# Patient Record
Sex: Female | Born: 1965 | Race: White | Hispanic: No | Marital: Married | Smoking: Never smoker
Health system: Southern US, Community
[De-identification: ages and names within clinical notes are randomized; demographics above are authoritative.]

## PROBLEM LIST (undated history)

## (undated) DIAGNOSIS — I10 Essential (primary) hypertension: Secondary | ICD-10-CM

## (undated) DIAGNOSIS — M1990 Unspecified osteoarthritis, unspecified site: Secondary | ICD-10-CM

## (undated) DIAGNOSIS — F32A Depression, unspecified: Secondary | ICD-10-CM

## (undated) DIAGNOSIS — G4730 Sleep apnea, unspecified: Secondary | ICD-10-CM

## (undated) DIAGNOSIS — F909 Attention-deficit hyperactivity disorder, unspecified type: Secondary | ICD-10-CM

## (undated) DIAGNOSIS — F419 Anxiety disorder, unspecified: Secondary | ICD-10-CM

## (undated) DIAGNOSIS — K754 Autoimmune hepatitis: Secondary | ICD-10-CM

## (undated) HISTORY — PX: KNEE SURGERY: SHX244

## (undated) HISTORY — PX: ABDOMINAL HYSTERECTOMY: SHX81

## (undated) HISTORY — PX: OTHER SURGICAL HISTORY: SHX169

## (undated) HISTORY — PX: CHOLECYSTECTOMY: SHX55

---

## 1999-05-17 ENCOUNTER — Ambulatory Visit (HOSPITAL_BASED_OUTPATIENT_CLINIC_OR_DEPARTMENT_OTHER): Admission: RE | Admit: 1999-05-17 | Discharge: 1999-05-17 | Payer: Self-pay | Admitting: General Surgery

## 2002-09-23 ENCOUNTER — Encounter: Payer: Self-pay | Admitting: Neurosurgery

## 2002-09-23 ENCOUNTER — Ambulatory Visit (HOSPITAL_COMMUNITY): Admission: RE | Admit: 2002-09-23 | Discharge: 2002-09-23 | Payer: Self-pay | Admitting: Neurosurgery

## 2003-11-10 ENCOUNTER — Other Ambulatory Visit: Admission: RE | Admit: 2003-11-10 | Discharge: 2003-11-10 | Payer: Self-pay | Admitting: Obstetrics and Gynecology

## 2003-12-05 ENCOUNTER — Encounter (INDEPENDENT_AMBULATORY_CARE_PROVIDER_SITE_OTHER): Payer: Self-pay | Admitting: Specialist

## 2003-12-05 ENCOUNTER — Inpatient Hospital Stay (HOSPITAL_COMMUNITY): Admission: RE | Admit: 2003-12-05 | Discharge: 2003-12-07 | Payer: Self-pay | Admitting: Obstetrics and Gynecology

## 2003-12-14 ENCOUNTER — Ambulatory Visit (HOSPITAL_COMMUNITY): Admission: RE | Admit: 2003-12-14 | Discharge: 2003-12-14 | Payer: Self-pay | Admitting: Obstetrics and Gynecology

## 2004-11-11 ENCOUNTER — Other Ambulatory Visit: Admission: RE | Admit: 2004-11-11 | Discharge: 2004-11-11 | Payer: Self-pay | Admitting: Obstetrics and Gynecology

## 2005-03-04 ENCOUNTER — Ambulatory Visit (HOSPITAL_COMMUNITY): Admission: RE | Admit: 2005-03-04 | Discharge: 2005-03-04 | Payer: Self-pay | Admitting: Obstetrics and Gynecology

## 2006-01-02 ENCOUNTER — Emergency Department (HOSPITAL_COMMUNITY): Admission: EM | Admit: 2006-01-02 | Discharge: 2006-01-02 | Payer: Self-pay | Admitting: Family Medicine

## 2006-01-06 ENCOUNTER — Emergency Department (HOSPITAL_COMMUNITY): Admission: EM | Admit: 2006-01-06 | Discharge: 2006-01-06 | Payer: Self-pay | Admitting: Family Medicine

## 2007-09-08 ENCOUNTER — Ambulatory Visit (HOSPITAL_COMMUNITY): Admission: RE | Admit: 2007-09-08 | Discharge: 2007-09-08 | Payer: Self-pay | Admitting: Obstetrics and Gynecology

## 2007-09-08 ENCOUNTER — Other Ambulatory Visit: Admission: RE | Admit: 2007-09-08 | Discharge: 2007-09-08 | Payer: Self-pay | Admitting: Obstetrics and Gynecology

## 2007-10-26 ENCOUNTER — Other Ambulatory Visit: Payer: Self-pay | Admitting: Obstetrics and Gynecology

## 2007-10-26 ENCOUNTER — Ambulatory Visit: Payer: Self-pay | Admitting: Obstetrics and Gynecology

## 2007-11-03 ENCOUNTER — Encounter: Admission: RE | Admit: 2007-11-03 | Discharge: 2007-11-03 | Payer: Self-pay | Admitting: Family Medicine

## 2007-11-12 ENCOUNTER — Encounter: Admission: RE | Admit: 2007-11-12 | Discharge: 2007-11-12 | Payer: Self-pay | Admitting: Gastroenterology

## 2007-11-25 ENCOUNTER — Ambulatory Visit: Payer: Self-pay | Admitting: Obstetrics and Gynecology

## 2007-12-27 ENCOUNTER — Ambulatory Visit: Payer: Self-pay | Admitting: Obstetrics and Gynecology

## 2007-12-29 ENCOUNTER — Encounter (INDEPENDENT_AMBULATORY_CARE_PROVIDER_SITE_OTHER): Payer: Self-pay | Admitting: General Surgery

## 2007-12-29 ENCOUNTER — Ambulatory Visit: Payer: Self-pay | Admitting: Obstetrics and Gynecology

## 2007-12-29 ENCOUNTER — Ambulatory Visit (HOSPITAL_COMMUNITY): Admission: RE | Admit: 2007-12-29 | Discharge: 2007-12-30 | Payer: Self-pay | Admitting: General Surgery

## 2008-01-18 ENCOUNTER — Ambulatory Visit: Payer: Self-pay | Admitting: Obstetrics and Gynecology

## 2009-03-05 ENCOUNTER — Ambulatory Visit: Payer: Self-pay | Admitting: Obstetrics and Gynecology

## 2009-03-05 ENCOUNTER — Other Ambulatory Visit: Admission: RE | Admit: 2009-03-05 | Discharge: 2009-03-05 | Payer: Self-pay | Admitting: Obstetrics and Gynecology

## 2009-03-08 ENCOUNTER — Ambulatory Visit (HOSPITAL_COMMUNITY): Admission: RE | Admit: 2009-03-08 | Discharge: 2009-03-08 | Payer: Self-pay | Admitting: Obstetrics and Gynecology

## 2009-04-16 ENCOUNTER — Ambulatory Visit: Payer: Self-pay | Admitting: Obstetrics and Gynecology

## 2009-12-04 IMAGING — RF DG CHOLANGIOGRAM OPERATIVE
1 series · 4 of 4 positions shown · non-contrast
Comparison: Abdominal CT 11/12/2007.

CLINICAL DATA: Symptomatic cholelithiasis.

INTRAOPERATIVE CHOLANGIOGRAM
TECHNIQUE: Multiple fluoroscopic spot radiographs were obtained
during intraoperative cholangiogram and are submitted for
interpretation post-operatively.

[Series 1: run · 4 of 53 frames shown]
[frame 3/53]
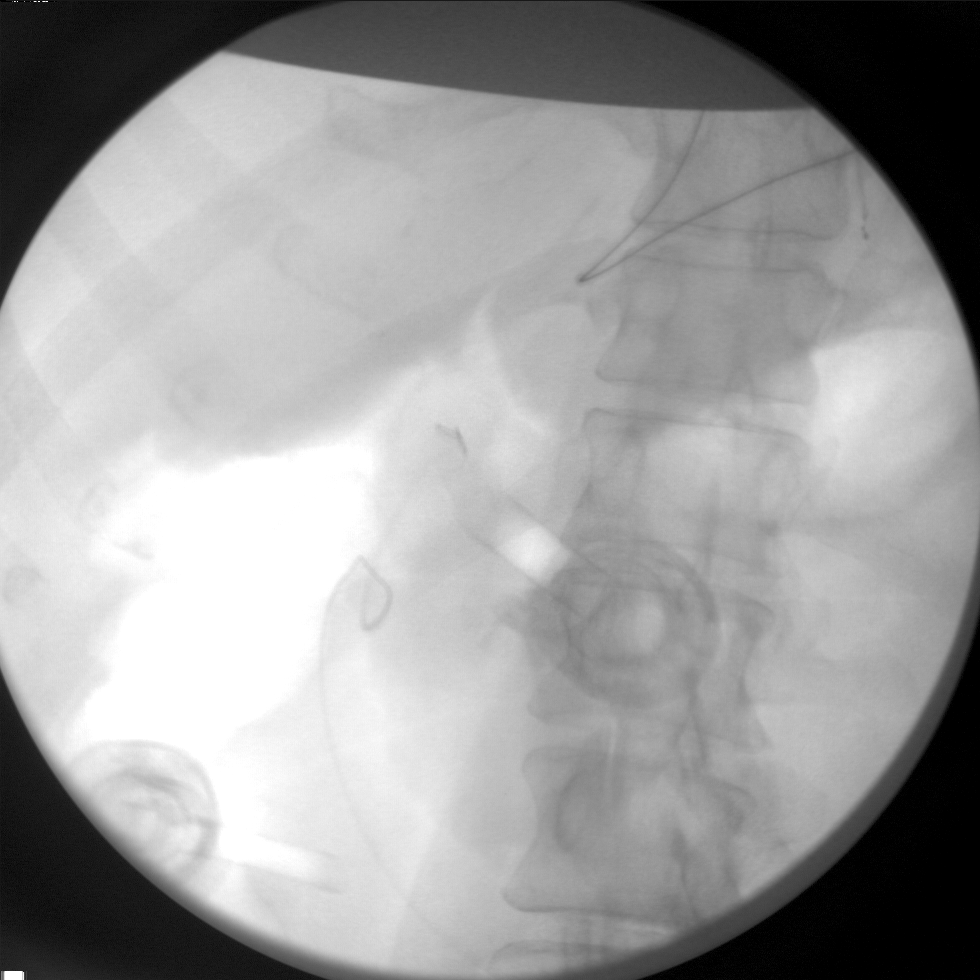
[frame 8/53]
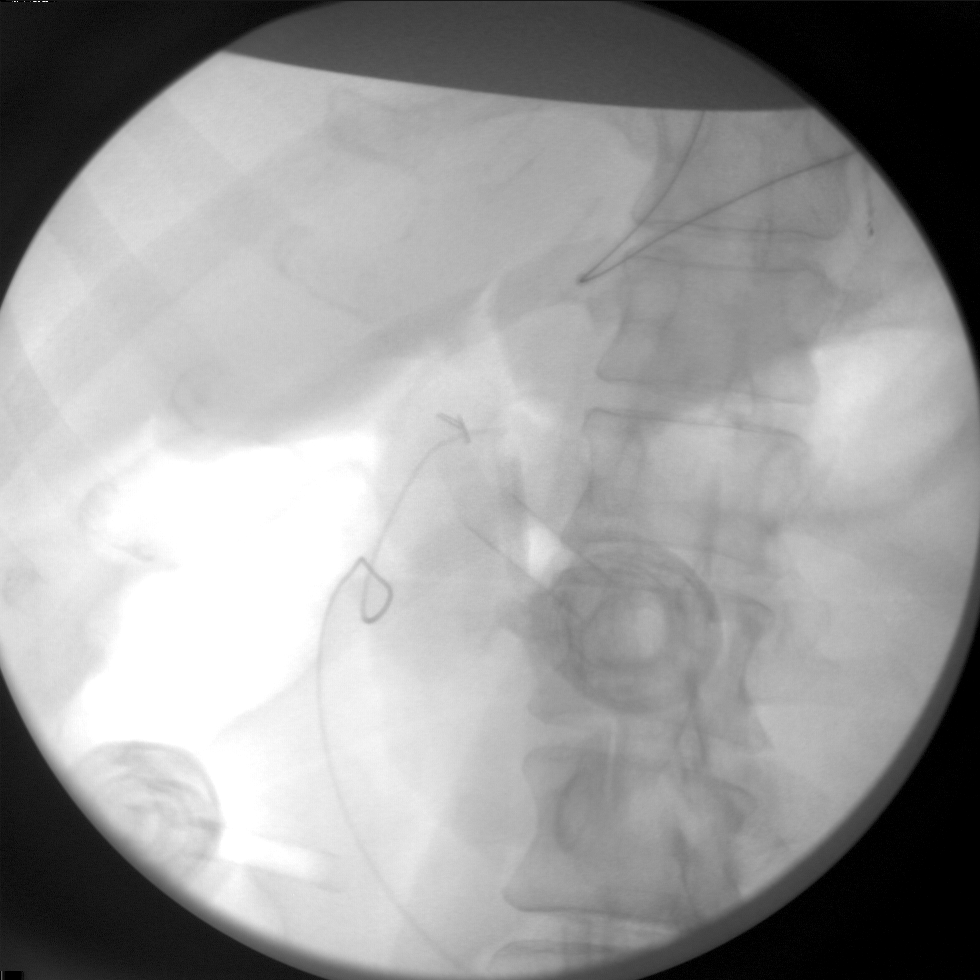
[frame 27/53]
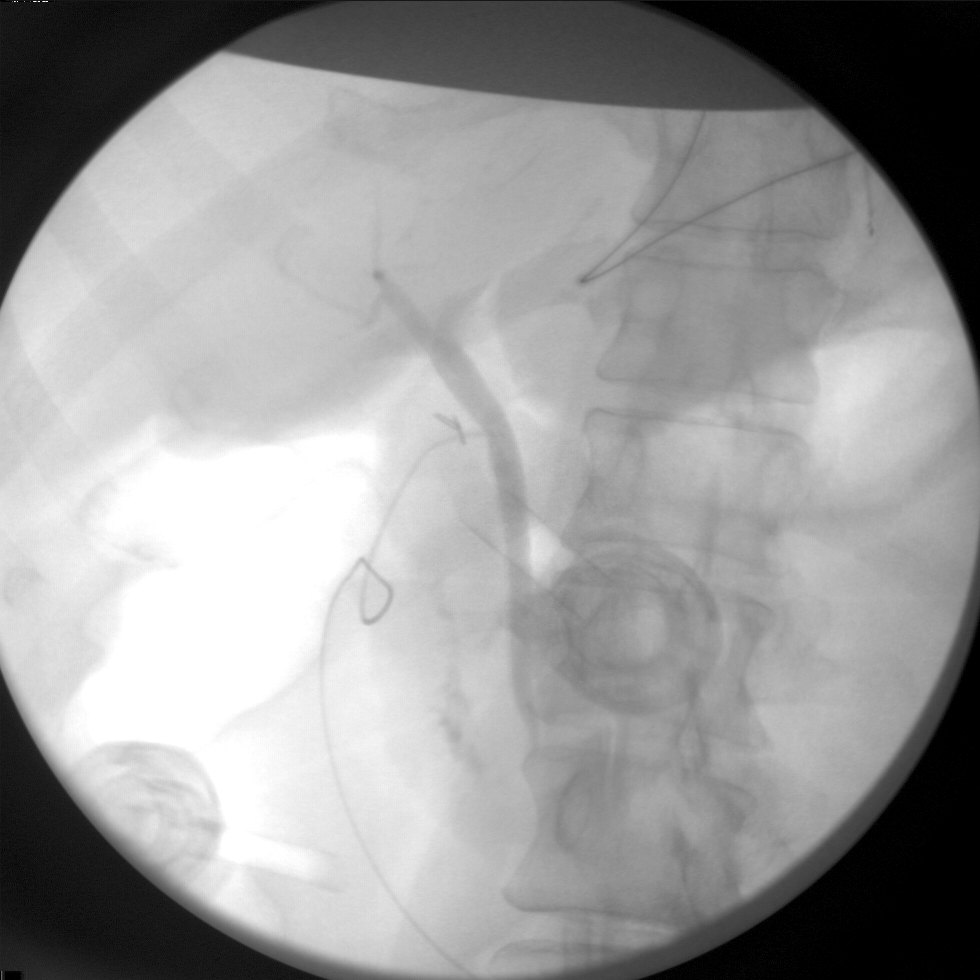
[frame 46/53]
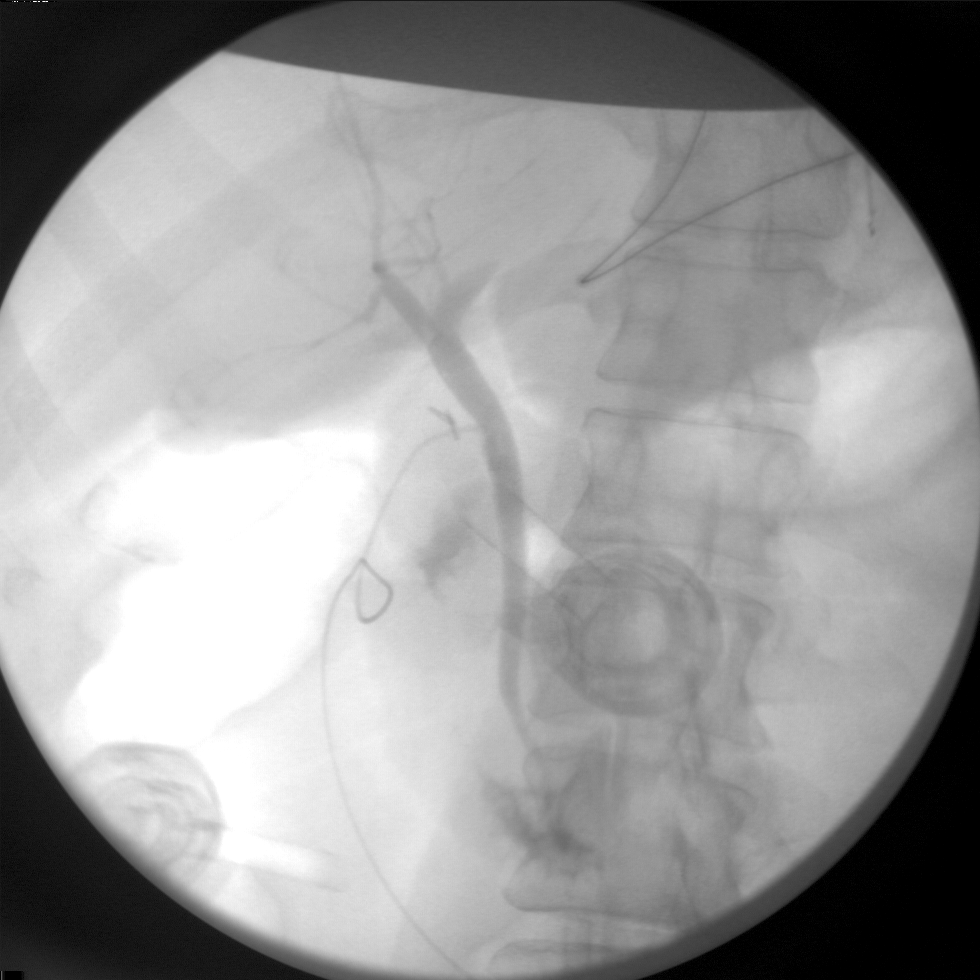

[4 of 4 positions shown; findings below may reference images not displayed]

FINDINGS: No biliary dilatation is demonstrated.  There is drainage
into the duodenum and no extravasation.  It is difficult to exclude
some debris within the common bile duct, but no discrete calculi
are demonstrated.  There is no evidence of ductal obstruction.

On the later images, there is linear activity medial to the distal
common bile duct which is of uncertain significance.  This likely
reflects reflux into the pancreatic duct rather than a low
insertion of an accessory bile duct; correlate clinically.
IMPRESSION: 1.  No evidence of ductal obstruction or definite retained
calculus.
2.  Probable reflux of the pancreatic duct.

## 2010-06-25 NOTE — Op Note (Signed)
[name], [name]                   ACCOUNT NO.:  [id number]   MEDICAL RECORD NO.:  [id number]          PATIENT TYPE:  OIB   LOCATION:  1533                         FACILITY:  [facility]   PHYSICIAN:  Adolph Pollack, M.D.DATE OF BIRTH:  1965/12/25   DATE OF PROCEDURE:  DATE OF DISCHARGE:                               OPERATIVE REPORT   PREOPERATIVE DIAGNOSIS:  Symptomatic cholelithiasis.   POSTOPERATIVE DIAGNOSIS:  Symptomatic cholelithiasis.   PROCEDURE:  Laparoscopic cholecystectomy with intraoperative  cholangiogram.   SURGEON:  Adolph Pollack, M.D.   ASSISTANT:  Leonie Man, MD.   ANESTHESIA:  General.   INDICATIONS:  This 45 year old female is having some upper abdominal  discomfort and nausea and was discovered to have gallstones with a  gallstone measuring 2.2 cm.  She also has some lower abdominal pain and  has plans to have a oophorectomy by Dr. Eda Paschal.  We plan to do a  laparoscopic cholecystectomy in combination with Dr. Verl Dicker  procedure and she presents for that.   TECHNIQUE:  She was seen in the holding area and brought to the  operating room, placed supine on the operating table and general  anesthetic was administered.  She was placed in the lithotomy position  and the abdominal wall and perineal areas were sterilely prepped and  draped.  Marcaine was infiltrated in the subumbilical region.  A small  subumbilical incision was made through the skin, subcutaneous tissue,  fascia and peritoneum entering the peritoneal cavity under direct  vision.  A pursestring suture of 0 Vicryl was placed around the fascial  edges.  A Hassan trocar was introduced to the peritoneal cavity and  pneumoperitoneum created by insufflation of CO2 gas.   Next, the laparoscope introduced and there were adhesions between the  falciform ligament and the right abdominal wall just anterior to the  liver.  Two 5-mm trocars were then placed in the right upper quadrant  and  these adhesions were lysed, freeing up to the midline area just  lateral to the falciform ligament on the right.  An 11-mm trocar was  placed in that area.   The fundus of the gallbladder was grasped and it was noted be somewhat  pale.  We retracted toward the right shoulder.  I then mobilized the  infundibulum and retracted it laterally.  Using blunt dissection, I  isolated the cystic duct and created a window around it.  I then placed  a clip at the cystic duct gallbladder junction and made a small incision  in the cystic duct.  A cholangiocath was passed through the anterior  abdominal wall and placed into the cystic duct and cholangiogram was  formed.   Under real-time fluoroscopy, dilute contrast was injected into the  cystic duct which was of short length.  The common hepatic, right and  left hepatic, common bile ducts all filled promptly and contrast drained  promptly into the duodenum without obvious evidence of obstruction.  Final report is pending the radiologist's interpretation.   The cholangiocath was removed, the cystic duct was clipped three times  on the  biliary side and divided.  I then identified anterior and  posterior branches of the cystic artery and created windows around these  and divided them between clips.  The gallbladder was dissected free from  the liver using electrocautery.  There were intrahepatic portions of the  gallbladder near the body of the fundus and some spillage of bile  occurred with small puncture wounds in the gallbladder.  Once the  gallbladder was removed from the liver it was placed in an Endopouch  bag.   The gallbladder fossa was then copiously irrigated with saline solution  and bleeding points controlled  with electrocautery.  Fluid was  evacuated.  Surgicel was placed in the gallbladder fossa.  Following  this, Dr. Eda Paschal proceeded with his part of the procedure and is to  be dictated in a separate note.   At the end of Dr.  Verl Dicker procedure, I then inspected the  gallbladder fossa and it was hemostatic without evidence of bile leak.  I evacuated as much of the irrigation fluid as possible.  I then removed  the subumbilical trocar and all of the 5-mm trocars (two more had been  put in by Dr. Eda Paschal).  I closed the subumbilical fascial defect with  interrupted 0 Vicryl sutures.  I then inspected this by way of  laparoscopy and the closure was satisfactory.  The remaining trocar was  removed and CO2 gas released.   All skin incisions were then closed with 4-0 Monocryl subcuticular  stitches.  Steri-Strips and sterile dressings were applied.  She  tolerated the procedure without any apparent complications and was taken  to recovery in satisfactory condition.      Adolph Pollack, M.D.  Electronically Signed     TJR/MEDQ  D:  12/29/2007  T:  12/30/2007  Job:  [id number]   cc:   Reuel Boom L. Eda Paschal, M.D.  Fax: [phone]   Duncan Dull, M.D.  Fax: [phone]

## 2010-06-25 NOTE — Op Note (Signed)
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   POSTOPERATIVE DIAGNOSES:  Pelvic pain, pelvic adhesions, severe  dyspareunia.   OPERATION:  Laparoscopy with bilateral salpingo-oophorectomy after  massive lysis of pelvic adhesions.   SURGEON:  Dr. Eda [name]   FIRST ASSISTANT:  Dr. Abbey [name]   INDICATIONS:  The patient is a 45 year old female who had been scheduled  for laparoscopic cholecystectomy because of upper abdominal pain and the  findings of multiple gallstones.  She had recurrent ovarian cyst  although at the moment she appears to be free of them.  They get very  large and then they will resolve, and for a while we thought that this  was the source of her pelvic pain.  However, they are now gone and she  is still having pelvic pain, some of which certainly could be due to her  gallbladder, but some of which I think is due to pelvic adhesive disease  after a TAH.  She also has dyspareunia which I certainly do not think  could be caused by the gallbladder disease.  A long consult was held  with the patient.  She was counseled strongly about the risks of loosing  her ovaries at 79, but she would like me to proceed with surgery.  The  plan was to do it laparoscopically after Dr. Abbey [name] did his  cholecystectomy and then do a laparotomy if we cannot do it  laparoscopically, if Dr. Abbey [name] and I were agreement that this was  appropriate.   FINDINGS AT THE TIME OF SURGERY:  The patient's right ovary was slightly  adherent both to the broad ligament and to some bowel.   However, the  left side, the ovary was densely adherent to small and large bowel, to  epiploic appendages, and was buried under sigmoid colon.   PROCEDURE:  I scrubbed into the case after Dr. Abbey [name] had finished  his laparoscopic cholecystectomy with IV cholangiogram.  We tilted the  patient back in dorsal lithotomy position.  We added two 5-mm trocars in  the right and left lower quadrant in order to get better access to the  pelvis.  I was able to free up the right ovary without much difficulty  using a scissor.  The ureter could be identified.  The right  infundibulopelvic ligament was bipolared and cut.  The rest of the  attachments of the ovary to the broad ligament were bipolared and cut as  well as the round ligament posteriorly.  Sharp dissection was done to  free up the peritoneum without using any thermal source.  Attention was  next turned to the left side.  This side was significantly  more  adherent.  Prior to putting in the 5-mm puncture sites and the pelvis,  Dr. Abbey [name] had freed up omentum which was obscuring our ability to  put a trocar in that area, and even after this there still were a lot of  adhesions.  He freed up the sigmoid colon.  Finally, I could see the  ovary and tube although it was still densely adherent.  It was elevated.  We very carefully dissected it free from surrounding structures.  We  finally could isolate the IP ligament and that was bipolared and cut.  We continued to dissect sharply trying not to use any thermal source at  all. At one point, there were several oozers there were controlled with  bipolar.  As I freed up the ovary more and more, we finally found her  ureter.  We had previously entered the retroperitoneal space and had  water-dissected it out, but continued dissection allowed [place] to see the  ureter.  It was extremely close to the ovary.  As we freed it up, we  could free it from the ovary.  We followed its course all the way  into  the tunnel before it enters the bladder and at no time did we injure it.  We finally were able to completely free up the ovary and tube.  There  were some bleeding on the left side, nowhere near the ureter, that was  controlled with bipolar coagulation.  Finally, both adnexa were free.  They were removed in an Endopouch and sent to Pathology for tissue  diagnosis.  There was no obvious bleeding.  Copious irrigation was done  with sterile saline.  However, because there was a raw bed on the left  side, we placed a Surgicel just to be sure.  We did not feel that we had  left any ovarian remnant.  However, because of the denseness of all the  adhesions, certainly that is a possibility if the patient has any future  issues.  The patient left the Operating Room in satisfactory condition,  draining clear urine from her Foley catheter.  Dr. Abbey [name] has  dictated his note as well as the closing note.      Samantha L. [name], M.D.  Electronically Signed     DLG/MEDQ  D:  12/29/2007  T:  12/30/2007  Job:  [id number]

## 2010-06-28 NOTE — Op Note (Signed)
NAME:  [name], [name]                             ACCOUNT NO.:  [id number]   MEDICAL RECORD NO.:  [id number]                   PATIENT TYPE:  OIB   LOCATION:  NA                                   FACILITY:  [facility]   PHYSICIAN:  Donalee Citrin, M.D.                     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   PROCEDURE:  Lumbar laminectomy, microdiscectomy L3-L4 left.   SURGEON:  Donalee Citrin, M.D.   ASSISTANT:   ANESTHESIA:  General endotracheal anesthesia.   HISTORY OF PRESENT ILLNESS:  The patient is a very pleasant 45 year old  female who has had several weeks of progressively worsening back and left  leg pain radiating down to her anterior shin consistent with L4  radiculopathy.  The patient has been refractory to conservative treatment  with progressive worsening back and leg pain.  Preoperative imaging showed a  very large ruptured disc with free fragment migrated inferiorly compressing  the L4 nerve root against the L4 pedicle.  The patient was recommended  lumbar laminectomy with microdiscectomy.  I discussed the risks and benefits  of surgery with her, she understood and agreed to proceed forth.   DESCRIPTION OF PROCEDURE:  The patient was brought to the operating room,  given general anesthesia, positioned prone on the Wilson frame, the back was  prepped and draped in the usual sterile fashion.  We initially localized the  L3-L4 disc space and then using high speed drill, the medial aspect of the  facet complex was drilled down and using a 3 mm Kerrison punch, the inferior  aspect of the lamina at L3 and the medial aspect of the facet complex as  well as part of the  superior aspect of the lamina of L4 was removed as well  as the ligamentum flavum removed in a piecemeal fashion exposing the thecal  sac.  The operating microscope was draped, brought onto the field, and under  microscopic illumination, the thecal sac was decompressed laterally exposing  the L3 nerve root.  Then, using a #4 Penfield, the L3 nerve root was  dissected off a large free fragment disc just medial to the pedicle.  This  was teased away with a blunt nerve hook and removed with pituitary rongeurs.  Then, the annulus was incised with a 15 blade scalpel.  Pituitary rongeurs  were used clean up the disc space.  At the end of the discectomy, there were  no further fragments within the disc space, and using a coronary dilator and  angled hockey stick, there were  no further fragments appreciated from the  medial aspect of the pedicle down the track of the L4 nerve root.  The  thecal sac was well decompressed as well as the L4 nerve root.  The wound  was copiously irrigated and meticulous hemostasis was maintained.  Gelfoam  was placed over the top of the wound.  The muscle and fascia were  reapproximated with 0 interrupted Vicryl and the subcutaneous tissue was  closed with 2-0 interrupted Vicryl.  The skin was closed with running 4-0  subcuticular.  Benzoin and Steri-Strips were applied.  The patient went to  the recovery room in stable condition.                                              Donalee Citrin, M.D.   GC/MEDQ  D:  09/23/2002  T:  09/23/2002  Job:  [id number]

## 2010-06-28 NOTE — Op Note (Signed)
[name], [name]                   ACCOUNT NO.:  [id number]   MEDICAL RECORD NO.:  [id number]          PATIENT TYPE:  INP   LOCATION:  9399                          FACILITY:  [facility]   PHYSICIAN:  Daniel L. Gottsegen, M.D.DATE OF BIRTH:  [birth year]   DATE OF PROCEDURE:  12/05/2003  DATE OF DISCHARGE:                                 OPERATIVE REPORT   PREOPERATIVE DIAGNOSES:  Multiple fibroids with dysmenorrhea and  menorrhagia.   POSTOPERATIVE DIAGNOSES:  Multiple fibroids with dysmenorrhea and  menorrhagia.   OPERATION:  Total abdominal hysterectomy, cystostomy with repair of  cystostomy.   SURGEON:  Daniel L. Eda Paschal, M.D.   FIRST ASSISTANT:  Juan H. Lily Peer, M.D.  December 04, 2003   ANESTHESIA:  General endotracheal.   FINDINGS:  At the time of the surgery, the patient's uterus was enlarged by  multiple fibroids, there were approximately 20 present.  Total size of the  uterus was 10-11 weeks.  Ovaries and fallopian tubes were normal, pelvic  peritoneum was normal. The patient had had an echo free 3 cm cyst on her  left ovary 3-4 weeks ago when she had her ultrasound and it was now gone.  After the fascia had been opened, there was dense adherence of the bladder  to the superperitoneal tissue and the bladder was inadvertently entered.  It  appeared to extend almost to the top of where her Pfannenstiel incision was  made. After it had been entered prior to repairing, indigo carmine was  introduced IV and her ureteral orifices were very far below where the  laceration had occurred, it was high in the dome of the bladder.   DESCRIPTION OF PROCEDURE:  After adequate general endotracheal anesthesia,  the patient was placed in the supine posterior, prepped and draped in the  usual sterile manner. A Foley catheter was inserted into her bladder.  A  Pfannenstiel incision was made, the fascia was opened transversely. At this  point, the bladder appeared to be densely adherent at  a higher level than  normal, see above note.  Extreme care was taken and it was felt that the  peritoneum had been entered without injuring the bladder. This had been done  by entering the peritoneum very very high. The peritoneal cavity was clearly  entered and at this point the hysterectomy was started. Shortly after this,  the anesthetist reported that there was frank blood in her urine.  It was  felt that we should go ahead and do the hysterectomy first and then see if  there had been a bladder injury.  The round ligaments were sutured and cut  with #1 chromic catgut and the vesicouterine fold to peritoneum was sharply  dissected free. The uterus was delivered, the uteroovarian ligaments and  fallopian tubes were clamped, cut and doubly sutured with #1 chromic catgut  and this freed up the uterus some but the uterus still was awfully large to  deliver through the incision. The size of the incision was fairly small  because of the issues with the bladder discussed above therefore several  myomectomies were done and once these were done the uterus could be  completely delivered. The uterine arteries could then be clamped, cut and  doubly suture ligated with #1 chromic catgut. A supracervical hysterectomy  was then performed, the cervix was grasped, the bladder was advanced by  sharp dissection without injuring the bladder and then the parametrium was  taken down in successive bites by clamping, cutting and suture ligating with  #1 chromic catgut.  The cervicovaginal junction was identified, entered with  sharp dissection, the cervix was also sent to pathology as was the uterus.  Angled sutures were placed in the angles of the vagina and the cuff was  closed with figure-of-eights of #1 chromic catgut. At this point, the space  of Retzius was inspected and we could now see where the bladder had been  lacerated. It appeared that with the retractor, the incision had extended  somewhat so that  it was probably 3-4 inches long now.  Indigo carmine was  introduced IV and the ureteral orifices could be identified way below the  area of the laceration so that a good bladder closure could be done without  impairing the ureters. This was done in two layers, the first layer was 3-0  Vicryl, the second layer was 2-0 Vicryl bearing the first layer. At this  point, 300 mL of indigo carmine with irrigation was injected through the  Foley and it was a water tight closure.  The peritoneum was closed with a  running #0 Vicryl taking care to stay away from where the bladder closure  was.  Two sponge, needle and instrument counts were correct.  A subfascial  Jackson-Pratt drain was placed because of concern that there might be some  leakage of urine and to prevent a seroma and then the fascia was closed with  two running #0 Vicryl, the skin was closed with staples. The 3-0 silk was  utilized to suture the Jackson-Pratt drain in place.  Estimated blood loss  for the entire procedure was 200 mL. At the termination of the procedure,  there was no more blood in the Foley and it was just draining blue urine.  The patient left the operating room in satisfactory condition.     Dani   DLG/MEDQ  D:  12/05/2003  T:  12/05/2003  Job:  [id number]

## 2010-06-28 NOTE — Discharge Summary (Signed)
[name], [name]                   ACCOUNT NO.:  [id number]   MEDICAL RECORD NO.:  [id number]          PATIENT TYPE:  INP   LOCATION:  9307                          FACILITY:  [facility]   PHYSICIAN:  Daniel L. Gottsegen, M.D.DATE OF BIRTH:  [birth year]   DATE OF ADMISSION:  12/05/2003  DATE OF DISCHARGE:  12/07/2003                                 DISCHARGE SUMMARY   The patient is a 45 year old female who was admitted to the hospital with  enlarging fibroids, dysmenorrhea, dysfunctional uterine bleeding.  On the  day of admission, she was taken to the operating room and a total abdominal  hysterectomy was performed. The patient had significant adhesive disease  between her bladder and the rectus muscles and a cystotomy was done  incidentally at the same time. The cystostomy was repaired. Postoperatively  she did well.  Initially she had a mild ileus but by the end of the second  day having used IV's and Dulcolax suppositories she had passed gas. She was  therefore ready for discharge. She was discharged on Macrobid b.i.d. until  her Foley could be removed. She was discharged on MiraLax for bowel control  and she was discharged on Darvocet-N 100 for pain relief. The plan is for  her to have a cystogram nine days after the procedure to be sure that the  bladder is healed well and assuming it has, her Foley will be removed the  same day. 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   CONDITION ON DISCHARGE:  Improved.   DISCHARGE DIAGNOSES:  Enlarging fibroids with dysfunctional uterine bleeding  and dysmenorrhea.   OPERATION:  Total abdominal hysterectomy, cystostomy with repair of above.     Dani   DLG/MEDQ  D:  01/04/2004  T:  01/05/2004  Job:  [id number]

## 2010-11-11 LAB — BASIC METABOLIC PANEL
BUN: 12
CO2: 27
Calcium: 9
Chloride: 102
Creatinine, Ser: 1.03
GFR calc Af Amer: >60
GFR calc non Af Amer: 58 — ABNORMAL LOW
Glucose, Bld: 102 — ABNORMAL HIGH
Potassium: 3.2 — ABNORMAL LOW
Sodium: 136

## 2010-11-11 LAB — CBC
HCT: 44.8
Hemoglobin: 15.4 — ABNORMAL HIGH
MCHC: 34.5
MCV: 93.1
Platelets: 268
RBC: 4.81
RDW: 12.3
WBC: 8.5

## 2010-11-11 LAB — URINALYSIS, ROUTINE W REFLEX MICROSCOPIC
Bilirubin Urine: NEGATIVE
Glucose, UA: NEGATIVE
Hgb urine dipstick: NEGATIVE
Ketones, ur: NEGATIVE
Nitrite: NEGATIVE
Protein, ur: NEGATIVE
Specific Gravity, Urine: <1.005 — ABNORMAL LOW
Urobilinogen, UA: 0.2
pH: 6

## 2010-11-12 LAB — DIFFERENTIAL
Basophils Absolute: 0
Basophils Relative: 0
Eosinophils Absolute: 0.1
Eosinophils Relative: 1
Lymphocytes Relative: 35
Lymphs Abs: 3.3
Monocytes Absolute: 0.5
Monocytes Relative: 5
Neutro Abs: 5.5
Neutrophils Relative %: 59
Smear Review: ADEQUATE

## 2010-11-12 LAB — CBC
HCT: 42.6
Hemoglobin: 14.6
MCHC: 34.4
MCV: 94.3
Platelets: ADEQUATE
RBC: 4.52
RDW: 12.9
WBC: 9.4

## 2010-11-12 LAB — COMPREHENSIVE METABOLIC PANEL
ALT: 37 — ABNORMAL HIGH
AST: 26
Albumin: 3.4 — ABNORMAL LOW
Alkaline Phosphatase: 62
BUN: 12
CO2: 27
Calcium: 9
Chloride: 107
Creatinine, Ser: 1.07
GFR calc Af Amer: >60
GFR calc non Af Amer: 56 — ABNORMAL LOW
Glucose, Bld: 96
Potassium: 2.8 — ABNORMAL LOW
Sodium: 140
Total Bilirubin: 0.6
Total Protein: 5.8 — ABNORMAL LOW

## 2018-08-31 ENCOUNTER — Other Ambulatory Visit: Payer: Self-pay | Admitting: Nurse Practitioner

## 2018-08-31 DIAGNOSIS — K754 Autoimmune hepatitis: Secondary | ICD-10-CM

## 2018-10-01 ENCOUNTER — Other Ambulatory Visit: Payer: Self-pay

## 2018-10-01 ENCOUNTER — Ambulatory Visit (HOSPITAL_COMMUNITY)
Admission: RE | Admit: 2018-10-01 | Discharge: 2018-10-01 | Disposition: A | Discharge status: Home / Self Care | Payer: Managed Care, Other (non HMO) | Source: Ambulatory Visit | Attending: Nurse Practitioner | Admitting: Nurse Practitioner

## 2018-10-01 DIAGNOSIS — K754 Autoimmune hepatitis: Secondary | ICD-10-CM | POA: Insufficient documentation

## 2019-03-29 ENCOUNTER — Other Ambulatory Visit: Payer: Self-pay | Admitting: Nurse Practitioner

## 2019-03-29 DIAGNOSIS — K754 Autoimmune hepatitis: Secondary | ICD-10-CM

## 2019-04-13 ENCOUNTER — Ambulatory Visit
Admission: RE | Admit: 2019-04-13 | Discharge: 2019-04-13 | Disposition: A | Discharge status: Home / Self Care | Payer: Managed Care, Other (non HMO) | Source: Ambulatory Visit | Attending: Nurse Practitioner | Admitting: Nurse Practitioner

## 2019-04-13 DIAGNOSIS — K754 Autoimmune hepatitis: Secondary | ICD-10-CM

## 2019-09-22 ENCOUNTER — Other Ambulatory Visit: Payer: Self-pay | Admitting: Nurse Practitioner

## 2019-09-22 DIAGNOSIS — K754 Autoimmune hepatitis: Secondary | ICD-10-CM

## 2019-10-03 ENCOUNTER — Ambulatory Visit
Admission: RE | Admit: 2019-10-03 | Discharge: 2019-10-03 | Disposition: A | Discharge status: Home / Self Care | Payer: Managed Care, Other (non HMO) | Source: Ambulatory Visit | Attending: Nurse Practitioner | Admitting: Nurse Practitioner

## 2019-10-03 DIAGNOSIS — K754 Autoimmune hepatitis: Secondary | ICD-10-CM

## 2019-10-26 ENCOUNTER — Other Ambulatory Visit: Payer: Self-pay

## 2019-10-26 ENCOUNTER — Ambulatory Visit
Admission: RE | Admit: 2019-10-26 | Discharge: 2019-10-26 | Disposition: A | Discharge status: Home / Self Care | Payer: Managed Care, Other (non HMO) | Source: Ambulatory Visit

## 2019-10-26 VITALS — BP 128/89 | HR 103 | Temp 98.3°F | Resp 16

## 2019-10-26 DIAGNOSIS — J069 Acute upper respiratory infection, unspecified: Secondary | ICD-10-CM

## 2019-10-26 DIAGNOSIS — Z1152 Encounter for screening for COVID-19: Secondary | ICD-10-CM | POA: Diagnosis not present

## 2019-10-26 HISTORY — DX: Autoimmune hepatitis: K75.4

## 2019-10-26 MED ORDER — BENZONATATE 100 MG PO CAPS: 100.0000 mg | ORAL_CAPSULE | Freq: Three times a day (TID) | ORAL | 0 refills | Status: DC

## 2019-10-26 MED ORDER — FLUTICASONE PROPIONATE 50 MCG/ACT NA SUSP: 1.0000 | Freq: Every day | NASAL | 0 refills | Status: DC

## 2019-10-26 MED ORDER — CETIRIZINE HCL 10 MG PO TABS: 10.0000 mg | ORAL_TABLET | Freq: Every day | ORAL | 0 refills | Status: DC

## 2019-10-26 NOTE — ED Provider Notes (Signed)
[facility]   [id number] 10/26/19 Arrival Time: 0859   CC: COVID symptoms  SUBJECTIVE: History from: patient.  [name] is a 54 y.o. female who presented to the urgent care with a complaint of sore throat, cough that started last week.  Denies sick exposure to COVID, flu or strep.  Denies recent travel.  Has tried DC medication without relief.  Denies aggravating factors.  Denies previous symptoms in the past.   Denies fever, chills, fatigue, sinus pain, rhinorrhea, sore throat, SOB, wheezing, chest pain, nausea, changes in bowel or bladder habits.     ROS: As per HPI.  All other pertinent ROS negative.     Past Medical History:  Diagnosis Date   Autoimmune hepatitis (HCC)    History reviewed. No pertinent surgical history. Allergies  Allergen Reactions   Sulfa Antibiotics Hives   No current facility-administered medications on file prior to encounter.   Current Outpatient Medications on File Prior to Encounter  Medication Sig Dispense Refill   buPROPion (WELLBUTRIN XL) 150 MG 24 hr tablet Take by mouth.     lisinopril (ZESTRIL) 20 MG tablet TAKE 1 TABLET(20 MG) BY MOUTH DAILY     triamterene-hydrochlorothiazide (MAXZIDE-25) 37.5-25 MG tablet Take 1 tablet by mouth daily.     azaTHIOprine (IMURAN) 50 MG tablet Take by mouth.     VYVANSE 50 MG capsule Take 50 mg by mouth every morning.     Social History   Socioeconomic History   Marital status: Married    Spouse name: Not on file   Number of children: Not on file   Years of education: Not on file   Highest education level: Not on file  Occupational History   Not on file  Tobacco Use   Smoking status: Never Smoker   Smokeless tobacco: Never Used  Substance and Sexual Activity   Alcohol use: Not Currently   Drug use: Never   Sexual activity: Not on file  Other Topics Concern   Not on file  Social History Narrative   Not on file   Social Determinants of Health   Financial  Resource Strain:    Difficulty of Paying Living Expenses: Not on file  Food Insecurity:    Worried About Running Out of Food in the Last Year: Not on file   Ran Out of Food in the Last Year: Not on file  Transportation Needs:    Lack of Transportation (Medical): Not on file   Lack of Transportation (Non-Medical): Not on file  Physical Activity:    Days of Exercise per Week: Not on file   Minutes of Exercise per Session: Not on file  Stress:    Feeling of Stress : Not on file  Social Connections:    Frequency of Communication with Friends and Family: Not on file   Frequency of Social Gatherings with Friends and Family: Not on file   Attends Religious Services: Not on file   Active Member of Clubs or Organizations: Not on file   Attends Banker Meetings: Not on file   Marital Status: Not on file  Intimate Partner Violence:    Fear of Current or Ex-Partner: Not on file   Emotionally Abused: Not on file   Physically Abused: Not on file   Sexually Abused: Not on file   History reviewed. No pertinent family history.  OBJECTIVE:  Vitals:   10/26/19 0920  BP: 128/89  Pulse: (!) 103  Resp: 16  Temp: 98.3 F (36.8 C)  TempSrc: Oral  SpO2: 99%     General appearance: alert; appears fatigued, but nontoxic; speaking in full sentences and tolerating own secretions HEENT: NCAT; Ears: EACs clear, TMs pearly gray; Eyes: PERRL.  EOM grossly intact. Sinuses: nontender; Nose: nares patent without rhinorrhea, Throat: oropharynx clear, tonsils non erythematous or enlarged, uvula midline  Neck: supple without LAD Lungs: unlabored respirations, symmetrical air entry; cough: moderate; no respiratory distress; CTAB Heart: regular rate and rhythm.  Radial pulses 2+ symmetrical bilaterally Skin: warm and dry Psychological: alert and cooperative; normal mood and affect  LABS:  No results found for this or any previous visit (from the past 24 hour(s)).    ASSESSMENT & PLAN:  1. URI with cough and congestion   2. Encounter for screening for COVID-19     Meds ordered this encounter  Medications   benzonatate (TESSALON) 100 MG capsule    Sig: Take 1 capsule (100 mg total) by mouth every 8 (eight) hours.    Dispense:  30 capsule    Refill:  0   fluticasone (FLONASE) 50 MCG/ACT nasal spray    Sig: Place 1 spray into both nostrils daily for 14 days.    Dispense:  16 g    Refill:  0   cetirizine (ZYRTEC ALLERGY) 10 MG tablet    Sig: Take 1 tablet (10 mg total) by mouth daily.    Dispense:  30 tablet    Refill:  0    Discharge Instructions  COVID testing ordered.  It will take between 2-7 days for test results.  Someone will contact you regarding abnormal results.    In the meantime: You should remain isolated in your home for 10 days from symptom onset AND greater than 72 hours after symptoms resolution (absence of fever without the use of fever-reducing medication and improvement in respiratory symptoms), whichever is longer Get plenty of rest and push fluids Tessalon Perles prescribed for cough Zyrtec for nasal congestion, runny nose, and/or sore throat Flonase for nasal congestion and runny nose Use medications daily for symptom relief Use OTC medications like ibuprofen or tylenol as needed fever or pain Call or go to the ED if you have any new or worsening symptoms such as fever, worsening cough, shortness of breath, chest tightness, chest pain, turning blue, changes in mental status, etc...   Reviewed expectations re: course of current medical issues. Questions answered. Outlined signs and symptoms indicating need for more acute intervention. Patient verbalized understanding. After Visit Summary given.    Note: This document was prepared using Dragon voice recognition software and may include unintentional dictation errors.      Durward Parcel, FNP 10/26/19 [phone]

## 2019-10-26 NOTE — ED Triage Notes (Signed)
Pt presents with c/o sore throat and cough since last week

## 2019-10-26 NOTE — Discharge Instructions (Signed)
COVID testing ordered.  It will take between 2-7 days for test results.  Someone will contact you regarding abnormal results.    In the meantime: You should remain isolated in your home for 10 days from symptom onset AND greater than 72 hours after symptoms resolution (absence of fever without the use of fever-reducing medication and improvement in respiratory symptoms), whichever is longer Get plenty of rest and push fluids Tessalon Perles prescribed for cough Zyrtec for nasal congestion, runny nose, and/or sore throat Flonase for nasal congestion and runny nose Use medications daily for symptom relief Use OTC medications like ibuprofen or tylenol as needed fever or pain Call or go to the ED if you have any new or worsening symptoms such as fever, worsening cough, shortness of breath, chest tightness, chest pain, turning blue, changes in mental status, etc..Marland Kitchen

## 2019-10-28 LAB — NOVEL CORONAVIRUS, NAA: SARS-CoV-2, NAA: NOT DETECTED

## 2019-10-28 LAB — SARS-COV-2, NAA 2 DAY TAT

## 2020-01-03 ENCOUNTER — Other Ambulatory Visit: Payer: Self-pay | Admitting: Physician Assistant

## 2020-04-04 ENCOUNTER — Other Ambulatory Visit: Payer: Self-pay | Admitting: Nurse Practitioner

## 2020-04-04 DIAGNOSIS — K7402 Hepatic fibrosis, advanced fibrosis: Secondary | ICD-10-CM

## 2020-04-04 DIAGNOSIS — K754 Autoimmune hepatitis: Secondary | ICD-10-CM

## 2020-04-18 ENCOUNTER — Encounter: Payer: Self-pay | Admitting: Neurology

## 2020-04-18 ENCOUNTER — Ambulatory Visit: Payer: Managed Care, Other (non HMO) | Admitting: Neurology

## 2020-04-18 VITALS — BP 128/86 | HR 96 | Ht 68.0 in | Wt 211.0 lb

## 2020-04-18 DIAGNOSIS — K754 Autoimmune hepatitis: Secondary | ICD-10-CM

## 2020-04-18 DIAGNOSIS — E669 Obesity, unspecified: Secondary | ICD-10-CM

## 2020-04-18 DIAGNOSIS — G479 Sleep disorder, unspecified: Secondary | ICD-10-CM

## 2020-04-18 DIAGNOSIS — G4719 Other hypersomnia: Secondary | ICD-10-CM

## 2020-04-18 DIAGNOSIS — F988 Other specified behavioral and emotional disorders with onset usually occurring in childhood and adolescence: Secondary | ICD-10-CM

## 2020-04-18 DIAGNOSIS — G478 Other sleep disorders: Secondary | ICD-10-CM

## 2020-04-18 DIAGNOSIS — F39 Unspecified mood [affective] disorder: Secondary | ICD-10-CM

## 2020-04-18 DIAGNOSIS — R351 Nocturia: Secondary | ICD-10-CM

## 2020-04-18 DIAGNOSIS — Z820 Family history of epilepsy and other diseases of the nervous system: Secondary | ICD-10-CM

## 2020-04-18 DIAGNOSIS — G4761 Periodic limb movement disorder: Secondary | ICD-10-CM

## 2020-04-18 DIAGNOSIS — R0683 Snoring: Secondary | ICD-10-CM | POA: Diagnosis not present

## 2020-04-18 DIAGNOSIS — G2581 Restless legs syndrome: Secondary | ICD-10-CM

## 2020-04-18 NOTE — Progress Notes (Signed)
Subjective:    Patient ID: [name] is a 55 y.o. female.  HPI     Huston FoleySaima Sixto Bowdish, MD, PhD [facility] [address], [address] [address] [place], [place] [id number]  Dear Lawerance Bachupinder,   I saw your patient, [name], upon your kind request, in my Sleep clinic today for initial consultation of her sleep disorder, in particular, concern for underlying obstructive sleep apnea. The patient is unaccompanied today.  As you know, Ms. Lemont FillersCassady is a 55 year old right-handed woman with an underlying medical history of autoimmune hepatitis, ADD, depression, anxiety, arthritis and mild obesity, who reports snoring and excessive daytime somnolence, as well as restless and nonrestorative sleep.  She tries to keep healthy sleep habits, she tries to read before sleep and has a white noise machine in her bedroom.  She goes to bed at a certain time, she has a routine.  She generally is in bed between 930 and 1030 and while she falls asleep okay, she does not stay asleep well.  Rise time is typically between 7 and 8.  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  She lives with her husband, no children, has 2 dogs in the household, one of them typically sleeps on their bed with them.  She does not have a TV in her bedroom.  Her brother has sleep apnea and has a CPAP machine.  She would be willing to consider a CPAP machine.  She has had rising fatigue since her diagnosis of autoimmune hepatitis.  She has been on medication, immune suppressant medication in particular but it has not helped her fatigue.  She is a restless sleeper, she has restless leg symptoms occasionally and it helps to stretch.  She reports that her mom and maternal aunts have restless leg syndrome as well.  Mom recommended tonic water but patient does not like the taste of it.  I reviewed your office note from 1/4//22. Her Epworth sleepiness score is 12 out of 24, fatigue severity score is 47 out of  63. Of note, she is on Vyvanse.  She has been on it for several years, probably since 2011.  [facility].  She is also on Wellbutrin through their office.  She has been in counseling for anxiety and stress which has been helpful.  She is getting ready to schedule her right total knee replacement, she [place] an [place]opedic surgeon in [place].  She estimates that her surgery will be in May 2022.  She quit smoking when she was [age] years old.  She does not drink any alcohol, she drinks no caffeine day-to-day.  She denies recurrent morning headaches but has nocturia about twice per average night.  She has a prescription through her primary care for hydroxyzine as needed but rarely takes it because she does have some daytime grogginess from it.  Weight has been fluctuating, she has lost a little bit of weight lately.  She does move her legs in her sleep and her husband has noted some snoring as well.  Her Past Medical History Is Significant For: Past Medical History:  Diagnosis Date  . Autoimmune hepatitis (HCC)     Her Past Surgical History Is Significant For: Past Surgical History:  Procedure Laterality Date  . ABDOMINAL HYSTERECTOMY    . CHOLECYSTECTOMY    . KNEE SURGERY Bilateral   . nerve root compression      Her Family History Is Significant For:  History reviewed. No pertinent family history.  Her Social History Is Significant For: Social History   Socioeconomic History  . Marital status: Married    Spouse name: Not on file  . Number of children: Not on file  . Years of education: Not on file  . Highest education level: Not on file  Occupational History  . Not on file  Tobacco Use  . Smoking status: Never Smoker  . Smokeless tobacco: Never Used  Substance and Sexual Activity  . Alcohol use: Not Currently  . Drug use: Never  . Sexual activity: Not on file  Other Topics Concern  . Not on file  Social History Narrative  . Not on file    Social Determinants of Health   Financial Resource Strain: Not on file  Food Insecurity: Not on file  Transportation Needs: Not on file  Physical Activity: Not on file  Stress: Not on file  Social Connections: Not on file    Her Allergies Are:  Allergies  Allergen Reactions  . Sulfa Antibiotics Hives  :   Her Current Medications Are:  Outpatient Encounter Medications as of 04/18/2020  Medication Sig  . azaTHIOprine (IMURAN) 50 MG tablet Take by mouth.  Marland Kitchen BIOTIN PO Take by mouth.  Marland Kitchen buPROPion (WELLBUTRIN XL) 150 MG 24 hr tablet Take by mouth.  Marland Kitchen CALCIUM PO Take by mouth.  Marland Kitchen lisinopril (ZESTRIL) 20 MG tablet TAKE 1 TABLET(20 MG) BY MOUTH DAILY  . triamterene-hydrochlorothiazide (MAXZIDE-25) 37.5-25 MG tablet Take 1 tablet by mouth daily.  Marland Kitchen VYVANSE 50 MG capsule Take 50 mg by mouth every morning.  . [DISCONTINUED] benzonatate (TESSALON) 100 MG capsule Take 1 capsule (100 mg total) by mouth every 8 (eight) hours.  . [DISCONTINUED] cetirizine (ZYRTEC ALLERGY) 10 MG tablet Take 1 tablet (10 mg total) by mouth daily.  . [DISCONTINUED] fluticasone (FLONASE) 50 MCG/ACT nasal spray Place 1 spray into both nostrils daily for 14 days.   No facility-administered encounter medications on file as of 04/18/2020.  :  Review of Systems:  Out of a complete 14 point review of systems, all are reviewed and negative with the exception of these symptoms as listed below: Review of Systems  Neurological:       Here for sleep consult. No prior sleep study, fatigue and daytime sleepiness are present. Pt reports some snoring.     Objective:  Neurological Exam  Physical Exam Physical Examination:   Vitals:   04/18/20 1237  BP: 128/86  Pulse: 96  SpO2: 96%    General Examination: The patient is a very pleasant 55 y.o. female in no acute distress. She appears well-developed and well-nourished and well groomed.   HEENT: Normocephalic, atraumatic, pupils are equal, round and reactive to light,  extraocular tracking is good without limitation to gaze excursion or nystagmus noted. Hearing is grossly intact. Face is symmetric with normal facial animation. Speech is clear with no dysarthria noted. There is no hypophonia. There is no lip, neck/head, jaw or voice tremor. Neck is supple with full range of passive and active motion. There are no carotid bruits on auscultation. Oropharynx exam reveals: mild mouth dryness, good dental hygiene and mild airway crowding, due to Small airway entry, tonsillar size of 1-2+, left side a little easier to see than right, Mallampati class III, minimal overbite noted, neck circumference of 14 5/8 inches.  Tongue protrudes centrally and palate elevates symmetrically, nasal inspection reveals narrow nasal passages, no significant deviated septum.  Chest: Clear to auscultation without wheezing,  rhonchi or crackles noted.  Heart: S1+S2+0, regular and normal without murmurs, rubs or gallops noted.   Abdomen: Soft, non-tender and non-distended.  Extremities: There is no pitting edema in the distal lower extremities bilaterally.   Skin: Warm and dry without trophic changes noted.   Musculoskeletal: exam reveals no obvious joint deformities, tenderness or joint swelling or erythema.   Neurologically:  Mental status: The patient is awake, alert and oriented in all 4 spheres. Her immediate and remote memory, attention, language skills and fund of knowledge are appropriate. There is no evidence of aphasia, agnosia, apraxia or anomia. Speech is clear with normal prosody and enunciation. Thought process is linear. Mood is normal and affect is normal.  Cranial nerves II - XII are as described above under HEENT exam.  Motor exam: Normal bulk, strength and tone is noted. There is no tremor, Romberg is negative. Fine motor skills and coordination: grossly intact.  Cerebellar testing: No dysmetria or intention tremor. There is no truncal or gait ataxia.  Sensory exam: intact  to light touch in the upper and lower extremities.  Gait, station and balance: She stands easily. No veering to one side is noted. No leaning to one side is noted. Posture is age-appropriate and stance is narrow based. Gait shows normal stride length and normal pace. No problems turning are noted. Tandem walk is unremarkable.                Assessment and Plan:  In summary, Chauna Osoria is a very pleasant 55 y.o.-year old female with an underlying medical history of autoimmune hepatitis, ADD, depression, anxiety, arthritis of the R knee, and mild obesity, whose history and physical exam are concerning for obstructive sleep apnea (OSA).  She also endorses symptoms of restless leg syndrome and leg twitching at night.  She reports a family history of sleep apnea as well as restless leg syndrome. I had a long chat with the patient about my findings and the diagnosis of OSA, its prognosis and treatment options. We talked about medical treatments, surgical interventions and non-pharmacological approaches. I explained in particular the risks and ramifications of untreated moderate to severe OSA, especially with respect to developing cardiovascular disease down the Road, including congestive heart failure, difficult to treat hypertension, cardiac arrhythmias, or stroke. Even type 2 diabetes has, in part, been linked to untreated OSA. Symptoms of untreated OSA include daytime sleepiness, memory problems, mood irritability and mood disorder such as depression and anxiety, lack of energy, as well as recurrent headaches, especially morning headaches. We talked about trying to maintain a healthy lifestyle in general, as well as the importance of weight control. We also talked about the importance of good sleep hygiene. I recommended the following at this time: sleep study.  I explained the sleep test procedure to the patient and also outlined possible surgical and non-surgical treatment options of OSA, including the  use of a custom-made dental device (which would require a referral to a specialist dentist or oral surgeon), upper airway surgical options, such as traditional UPPP or a novel less invasive surgical option in the form of Inspire hypoglossal nerve stimulation (which would involve a referral to an ENT surgeon). I also explained the CPAP treatment option to the patient, who indicated that she would be willing to try CPAP if the need arises. I explained the importance of being compliant with PAP treatment, not only for insurance purposes but primarily to improve Her symptoms, and for the patient's long term health benefit,  including to reduce Her cardiovascular risks. I answered all her questions today and the patient was in agreement. I plan to see her back after the sleep study is completed and encouraged her to call with any interim questions, concerns, problems or updates.   Thank you very much for allowing me to participate in the care of this nice patient. If I can be of any further assistance to you please do not hesitate to call me at [phone].  Sincerely,   Huston Foley, MD, PhD

## 2020-04-18 NOTE — Patient Instructions (Signed)

## 2020-04-23 ENCOUNTER — Telehealth: Payer: Self-pay

## 2020-04-23 NOTE — Telephone Encounter (Signed)
LVM for pt to call me back to schedule sleep study  

## 2020-05-14 ENCOUNTER — Ambulatory Visit (INDEPENDENT_AMBULATORY_CARE_PROVIDER_SITE_OTHER): Payer: Managed Care, Other (non HMO) | Admitting: Neurology

## 2020-05-14 DIAGNOSIS — G478 Other sleep disorders: Secondary | ICD-10-CM

## 2020-05-14 DIAGNOSIS — G2581 Restless legs syndrome: Secondary | ICD-10-CM

## 2020-05-14 DIAGNOSIS — G4733 Obstructive sleep apnea (adult) (pediatric): Secondary | ICD-10-CM

## 2020-05-14 DIAGNOSIS — G4719 Other hypersomnia: Secondary | ICD-10-CM

## 2020-05-14 DIAGNOSIS — G4761 Periodic limb movement disorder: Secondary | ICD-10-CM

## 2020-05-14 DIAGNOSIS — R351 Nocturia: Secondary | ICD-10-CM

## 2020-05-14 DIAGNOSIS — F39 Unspecified mood [affective] disorder: Secondary | ICD-10-CM

## 2020-05-14 DIAGNOSIS — F988 Other specified behavioral and emotional disorders with onset usually occurring in childhood and adolescence: Secondary | ICD-10-CM

## 2020-05-14 DIAGNOSIS — Z820 Family history of epilepsy and other diseases of the nervous system: Secondary | ICD-10-CM

## 2020-05-14 DIAGNOSIS — K754 Autoimmune hepatitis: Secondary | ICD-10-CM

## 2020-05-14 DIAGNOSIS — G479 Sleep disorder, unspecified: Secondary | ICD-10-CM

## 2020-05-14 DIAGNOSIS — E66811 Obesity, class 1: Secondary | ICD-10-CM

## 2020-05-14 DIAGNOSIS — R0683 Snoring: Secondary | ICD-10-CM

## 2020-05-14 DIAGNOSIS — E669 Obesity, unspecified: Secondary | ICD-10-CM

## 2020-05-21 NOTE — Progress Notes (Signed)
See procedure note.

## 2020-05-22 NOTE — Procedures (Signed)
   [facility]  HOME SLEEP TEST (Watch PAT)  STUDY DATE: 05/14/2020  DOB: March 22, 1965  MRN: [id number]  ORDERING CLINICIAN: Huston Foley, MD, PhD  REFERRING CLINICIAN: Dr. Janace Hoard   CLINICAL INFORMATION/HISTORY: 55 year old woman with a history of autoimmune hepatitis, ADD, depression, anxiety, arthritis and mild obesity, who reports snoring and excessive daytime somnolence, as well as restless and nonrestorative sleep.    Epworth sleepiness score: 12/24.  BMI: 32.1 kg/m  Neck Circumference:14-5/8"  FINDINGS:   Total Record Time (hours, min): 9hrs  Total Sleep Time (hours, min):  8hrs   Percent REM (%):   30.0  Calculated pAHI (per hour):  19.8   REM pAHI:    39.1   NREM pAHI: 14.5  Supine AHI:18.4  Oxygen Saturation (%) Mean:93     Minimum oxygen saturation (%):             83   O2 Saturation Range (%): 83- 98  O2Saturation (minutes) <=88%:      0.1 min  Pulse Mean (bpm):   71   Pulse Range (40 - 114)  IMPRESSION: OSA (obstructive sleep apnea)   RECOMMENDATION:  This home sleep test demonstrates moderate obstructive sleep apnea with a total AHI of 19.8/hour and O2 nadir of 83%. Intermittent mild to moderate snoring was noted. Treatment with positive airway pressure is recommended. The patient will be advised to proceed with an autoPAP titration/trial at home for now. A full night titration study may be considered to optimize treatment settings, if needed down the road. Other treatment options may include weight loss and avoidance of the supine sleep position, surgical options, such as airway surgery or Inspire (an implantable tongue nerve stimulator), a dental option with an oral appliance. Please note that untreated obstructive sleep apnea may carry additional perioperative morbidity. Patients with significant obstructive sleep apnea should receive perioperative PAP therapy and the surgeons and particularly the anesthesiologist should be informed of  the diagnosis and the severity of the sleep disordered breathing. The patient should be cautioned not to drive, work at heights, or operate dangerous or heavy equipment when tired or sleepy. Review and reiteration of good sleep hygiene measures should be pursued with any patient. Other causes of the patient's symptoms, including circadian rhythm disturbances, an underlying mood disorder, medication effect and/or an underlying medical problem cannot be ruled out based on this test. Clinical correlation is recommended. The patient and her referring provider will be notified of the test results. The patient will be seen in follow up in sleep clinic at [facility].  I certify that I have reviewed the raw data recording prior to the issuance of this report in accordance with the standards of the American Academy of Sleep Medicine (AASM).   INTERPRETING PHYSICIAN:  Huston Foley, MD, PhD  Board Certified in Neurology and Sleep Medicine  [facility] [address] [place], [place] [address] [phone]

## 2020-05-22 NOTE — Progress Notes (Signed)
Patient referred by Dr. Evelene Croon, seen by me on 39/22, patient had a HST on 05/14/20.    Please call and notify the patient that the recent home sleep test showed obstructive sleep apnea in the moderate range. I recommend treatment in the form of autoPAP, which means, that we don't have to bring her in for a sleep study with CPAP, but will let her start using a so called autoPAP machine at home, which is a CPAP-like machine with self-adjusting pressures. We will send the order to a local DME company (of her choice, or as per insurance requirement). The DME representative will fit her with a mask, educate her on how to use the machine, how to put the mask on, etc. I have placed an order in the chart. Please send the order, talk to patient, send report to referring MD. We will need a FU in sleep clinic for 10 weeks post-PAP set up, please arrange that with me or one of our NPs. Also reinforce the need for compliance with treatment. Thanks,   Huston Foley, MD, PhD [facility])

## 2020-05-22 NOTE — Addendum Note (Signed)
Addended by: Huston Foley on: 05/22/2020 05:38 PM   Modules accepted: Orders

## 2020-05-24 ENCOUNTER — Telehealth: Payer: Self-pay

## 2020-05-24 ENCOUNTER — Ambulatory Visit
Admission: RE | Admit: 2020-05-24 | Discharge: 2020-05-24 | Disposition: A | Discharge status: Home / Self Care | Payer: Managed Care, Other (non HMO) | Source: Ambulatory Visit | Attending: Nurse Practitioner | Admitting: Nurse Practitioner

## 2020-05-24 DIAGNOSIS — K754 Autoimmune hepatitis: Secondary | ICD-10-CM

## 2020-05-24 DIAGNOSIS — K7402 Hepatic fibrosis, advanced fibrosis: Secondary | ICD-10-CM

## 2020-05-24 NOTE — Telephone Encounter (Signed)
I called pt. No answer, left a message asking pt to call me back.   

## 2020-05-24 NOTE — Telephone Encounter (Signed)
I called pt. I advised pt that Dr. Frances Furbish reviewed their sleep study results and found that pt has moderate osa. Dr. Frances Furbish recommends that pt start auto-PAP. I reviewed PAP compliance expectations with the pt. Pt is agreeable to starting an auto-PAP. I advised pt that an order will be sent to a DME, [address], and [facility] will call the pt within about one week after they file with the pt's insurance. [facility] will show the pt how to use the machine, fit for masks, and troubleshoot the auto-PAP if needed. Pt understands about the national shortage of CPAPs. Pt will call back to schedule 31-90 day for f/u. Pt verbalized understanding to arrive 15 minutes early and bring their auto-PAP. A letter with all of this information in it will be mailed to the pt as a reminder. I verified with the pt that the address we have on file is correct. Pt verbalized understanding of results. Pt had no questions at this time but was encouraged to call back if questions arise. I have sent the order to [facility] and have received confirmation that they have received the order.

## 2020-05-24 NOTE — Telephone Encounter (Signed)
-----   Message from Huston Foley, MD sent at 05/22/2020  5:38 PM EDT ----- Patient referred by Dr. Evelene Croon, seen by me on 39/22, patient had a HST on 05/14/20.    Please call and notify the patient that the recent home sleep test showed obstructive sleep apnea in the moderate range. I recommend treatment in the form of autoPAP, which means, that we don't have to bring her in for a sleep study with CPAP, but will let her start using a so called autoPAP machine at home, which is a CPAP-like machine with self-adjusting pressures. We will send the order to a local DME company (of her choice, or as per insurance requirement). The DME representative will fit her with a mask, educate her on how to use the machine, how to put the mask on, etc. I have placed an order in the chart. Please send the order, talk to patient, send report to referring MD. We will need a FU in sleep clinic for 10 weeks post-PAP set up, please arrange that with me or one of our NPs. Also reinforce the need for compliance with treatment. Thanks,   Huston Foley, MD, PhD [facility])

## 2020-06-28 ENCOUNTER — Other Ambulatory Visit: Payer: Self-pay

## 2020-06-28 ENCOUNTER — Ambulatory Visit (HOSPITAL_COMMUNITY): Payer: Managed Care, Other (non HMO) | Attending: Physician Assistant | Admitting: Physical Therapy

## 2020-06-28 ENCOUNTER — Encounter (HOSPITAL_COMMUNITY): Payer: Self-pay | Admitting: Physical Therapy

## 2020-06-28 DIAGNOSIS — M6281 Muscle weakness (generalized): Secondary | ICD-10-CM

## 2020-06-28 DIAGNOSIS — M25561 Pain in right knee: Secondary | ICD-10-CM | POA: Diagnosis present

## 2020-06-28 DIAGNOSIS — M25661 Stiffness of right knee, not elsewhere classified: Secondary | ICD-10-CM | POA: Diagnosis present

## 2020-06-28 DIAGNOSIS — R262 Difficulty in walking, not elsewhere classified: Secondary | ICD-10-CM | POA: Diagnosis present

## 2020-06-28 DIAGNOSIS — G8929 Other chronic pain: Secondary | ICD-10-CM

## 2020-06-28 NOTE — Therapy (Signed)
[facility] [address] [place], [place], [address] Phone: [phone]   Fax:  [phone]  Physical Therapy Evaluation  Patient Details  Name: Samantha AbuMary Ellen [name] MRN: [id number] Date of Birth: [birth year] Referring Provider (PT): Carroll SageHoward Homesley MD   Encounter Date: 06/28/2020   PT End of Session - 06/28/20 1319    Visit Number 1    Number of Visits 18    Date for PT Re-Evaluation 08/09/20    Authorization Type cigna managed no auth, 20 VL    Authorization - Visit Number 1    Authorization - Number of Visits 20    Progress Note Due on Visit 10    PT Start Time 1319    PT Stop Time 1359    PT Time Calculation (min) 40 min    Activity Tolerance Patient tolerated treatment well    Behavior During Therapy [facility] for tasks assessed/performed           Past Medical History:  Diagnosis Date  . Autoimmune hepatitis [facility](HCC)     Past Surgical History:  Procedure Laterality Date  . ABDOMINAL HYSTERECTOMY    . CHOLECYSTECTOMY    . KNEE SURGERY Bilateral   . nerve root compression      There were no vitals filed for this visit.    Subjective Assessment - 06/28/20 1325    Subjective States prior to her knee surgery she felt like she had a lot of muscular knee pain and couldn't straighten her knee all the way out and this was for a couple of years. States that everything on her right side started to hurt because of her knee so she had surgery on 06/25/20 and since then she has been able to manage her pain with her medications. She has had some movements where the pain was a lot but otherwise it hasn't been too bad. States she has been sleeping on her side with a pillow between her knees. States that the last time she was able to do all her exercises at her last PT session a couple days ago while in the hospital but since she has been home she hasn't been able to activate her quad.    Pertinent History of right knee scope in 2017, lacking knee  extension for years on right    Patient Stated Goals to be able to walk without walker    Currently in Pain? Yes    Pain Score 2     Pain Location Knee    Pain Orientation Right    Pain Descriptors / Indicators Sore    Pain Type Surgical pain    Pain Radiating Towards around right knee    Pain Frequency Intermittent    Aggravating Factors  movement and walking              [facility]28/20 0001      Assessment   Medical Diagnosis R TKA    Referring Provider (PT) Carroll SageHoward Homesley MD    Next MD Visit 2 weeks post op      Balance Screen   Has the patient fallen in the past 6 months No      Home Environment   Living Environment Private residence    Living Arrangements Spouse/significant other    Available Help at Discharge Family    Type of Home House    Home Access Stairs to enter    Entrance Stairs-Number of Steps 4    Entrance Stairs-Rails  Right    Home Layout One level    Home Equipment Walker - 2 wheels;Bedside commode      Prior Function   Level of Independence Independent    Vocation Full time employment    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     Cognition   Overall Cognitive Status Within Functional Limits for tasks assessed      Observation/Other Assessments   Observations bandage in place, redness extending around middle of knee - no other redness noted    Skin Integrity bandage in place    Focus on Therapeutic Outcomes (FOTO)  32% function      Observation/Other Assessments-Edema    Edema --   throughout right thigh and knee     ROM / Strength   AROM / PROM / Strength AROM      AROM   AROM Assessment Site Knee    Right/Left Knee Left;Right    Right Knee Extension 18   lacking   Right Knee Flexion 92      Palpation   Palpation comment swelling noted and increased firmness throughout upper thigh. Tendenress to palpation surrounding right knee joint      Special Tests   Other special tests negative homan's  sign      Bed Mobility   Bed Mobility Supine to Sit;Sit to Supine    Supine to Sit Supervision/Verbal cueing   uses hands to pick up and move right leg   Sit to Supine Supervision/Verbal cueing   uses hands to pick up and move right leg     Transfers   Transfers Sit to Stand;Stand to Sit    Sit to Stand With upper extremity assist;5: Supervision   kicks out right leg   Stand to Sit 6: Modified independent (Device/Increase time);With armrests   kicks out right leg     Ambulation/Gait   Ambulation/Gait Yes    Ambulation/Gait Assistance 5: Supervision    Ambulation Distance (Feet) 226 Feet    Assistive device Rolling walker    Gait Pattern Step-to pattern;Decreased hip/knee flexion - left;Decreased step length - right;Decreased stride length;Decreased dorsiflexion - right;Decreased weight shift to right    Ambulation Surface Level;Indoor    Gait velocity decreased    Gait Comments                      Objective measurements completed on examination: See above findings.       [facility] Adult PT Treatment/Exercise - 06/28/20 0001      Exercises   Exercises Knee/Hip      Knee/Hip Exercises: Supine   Quad Sets 10 reps;Strengthening;Both   with DF and tactil and visual cues   Heel Slides 10 reps;AAROM;Right   with strap                 PT Education - 06/28/20 1405    Education Details on elevation and how to elevate, on ROM as much as tolerated in any position, on quad activation, on typical post operative presentation, on RW height (readjusted to patient's height)    Person(s) Educated Patient    Methods Explanation    Comprehension Verbalized understanding            PT Short Term Goals - 06/28/20 1415      PT SHORT TERM GOAL #1   Title Patient will demonstrate at least 5-110 degrees of right knee ROM    Time 3  Period Weeks    Status New    Target Date 07/19/20      PT SHORT TERM GOAL #2   Title Patient will report at least 50% improvement  in overall symptoms and/or function to demonstrate improved functional mobility    Time 3    Period Weeks    Status New    Target Date 07/19/20      PT SHORT TERM GOAL #3   Title Patient will be independent in self management strategies to improve quality of life and functional outcomes.    Time 3    Period Weeks    Status New    Target Date 07/19/20             PT Long Term Goals - 06/28/20 1416      PT LONG TERM GOAL #1   Title Patient will be able to ambulate at least 300 feet without an assistive device in 2 minutes to demonstrate improved walking mobility    Time 6    Period Weeks    Status New    Target Date 08/09/20      PT LONG TERM GOAL #2   Title Patient will improve on FOTO score to meet predicted outcomes to demonstrate improved functional mobility.    Time 6    Period Weeks    Status New    Target Date 08/09/20      PT LONG TERM GOAL #3   Title Patient will report at least 75% improvement in overall symptoms and/or function to demonstrate improved functional mobility    Time 6    Period Weeks    Status New    Target Date 08/09/20      PT LONG TERM GOAL #4   Title Patient will be able to ascend and descend 4 stairs with use of railing as needed with reciprocal gait pattern to demonstrate improved stair navigation    Time 6    Period Weeks    Status New    Target Date 08/09/20                  Plan - 06/28/20 1406    Clinical Impression Statement Patient is a 55 y.o. female who presents to physical therapy after undergoing right TKA on 06/25/20. She presents with significant swelling, weakness and ROM limitations that severely restrict her overall function and independence. Patient with history of previous right knee scope in 2017 and with knee extension limitations for the last couple years which may affect her overall ROM outcomes.  Patient will benefit from skilled physical therapy services to address these deficits to reduce pain, improve level  of function with ADLs, functional mobility tasks, and reduce risk for falls.    Personal Factors and Comorbidities Fitness;Comorbidity 2;Comorbidity 1    Comorbidities hx right knee scope and lacking knee extension for the last couple years    Examination-Activity Limitations Bathing;Sit;Sleep;Bed Mobility;Squat;Bend;Locomotion Level;Dressing;Transfers;Toileting;Stand;Carry;Stairs    Examination-Participation Restrictions Meal Prep;Occupation;Driving;Community Activity;Cleaning;Shop    Stability/Clinical Decision Making Stable/Uncomplicated    Clinical Decision Making Low    Rehab Potential Good    PT Frequency 3x / week    PT Duration 6 weeks    PT Treatment/Interventions ADLs/Self Care Home Management;Aquatic Therapy;Cryotherapy;Electrical Stimulation;Traction;Moist Heat;Balance training;Therapeutic exercise;Therapeutic activities;Manual techniques;Stair training;Gait training;DME Instruction;Neuromuscular re-education;Patient/family education;Dry needling;Passive range of motion;Scar mobilization;Joint Manipulations    PT Next Visit Plan edema management, quad activation, knee ROM    PT Home Exercise Plan heel slides, quad sets, continueous ROM, elevation  Consulted and Agree with Plan of Care Patient           Patient will benefit from skilled therapeutic intervention in order to improve the following deficits and impairments:  Pain,Decreased mobility,Difficulty walking,Decreased range of motion,Decreased strength,Decreased activity tolerance,Decreased endurance,Decreased skin integrity,Decreased scar mobility,Increased edema,Decreased knowledge of use of DME  Visit Diagnosis: Difficulty in walking, not elsewhere classified  Muscle weakness (generalized)  Chronic pain of right knee  Decreased range of motion (ROM) of right knee     Problem List There are no problems to display for this patient.  2:19 PM, 06/28/20 Tereasa Coop, DPT Physical Therapy with [facility]  [phone] office  [facility] [address] [place], [place], [address] Phone: [phone]   Fax:  [phone]  Name: [name] MRN: [id number] Date of Birth: 1966/01/07

## 2020-07-02 ENCOUNTER — Ambulatory Visit (HOSPITAL_COMMUNITY): Payer: Managed Care, Other (non HMO) | Admitting: Physical Therapy

## 2020-07-02 ENCOUNTER — Other Ambulatory Visit: Payer: Self-pay

## 2020-07-02 DIAGNOSIS — G8929 Other chronic pain: Secondary | ICD-10-CM

## 2020-07-02 DIAGNOSIS — R262 Difficulty in walking, not elsewhere classified: Secondary | ICD-10-CM | POA: Diagnosis not present

## 2020-07-02 DIAGNOSIS — M25661 Stiffness of right knee, not elsewhere classified: Secondary | ICD-10-CM

## 2020-07-02 DIAGNOSIS — M6281 Muscle weakness (generalized): Secondary | ICD-10-CM

## 2020-07-02 NOTE — Therapy (Signed)
[facility] [address] [place], [place], [address] Phone: [phone]   Fax:  [phone]  Physical Therapy Treatment  Patient Details  Name: [name] MRN: [id number] Date of Birth: [birth year] Referring Provider (PT): Carroll Sage MD   Encounter Date: 07/02/2020   PT End of Session - 07/02/20 1318    Visit Number 2    Number of Visits 18    Date for PT Re-Evaluation 08/09/20    Authorization Type cigna managed no auth, 20 VL    Authorization - Visit Number 2    Authorization - Number of Visits 20    Progress Note Due on Visit 10    PT Start Time 0840    PT Stop Time 0915    PT Time Calculation (min) 35 min    Activity Tolerance Patient tolerated treatment well    Behavior During Therapy [facility] for tasks assessed/performed           Past Medical History:  Diagnosis Date  . Autoimmune hepatitis [facility])     Past Surgical History:  Procedure Laterality Date  . ABDOMINAL HYSTERECTOMY    . CHOLECYSTECTOMY    . KNEE SURGERY Bilateral   . nerve root compression      There were no vitals filed for this visit.                      [facility] Adult PT Treatment/Exercise - 07/02/20 0001      Knee/Hip Exercises: Stretches   Knee: Self-Stretch to increase Flexion Right;10 seconds    Knee: Self-Stretch Limitations 10 reps on 12" step      Knee/Hip Exercises: Seated   Long Arc Quad Right;10 reps      Knee/Hip Exercises: Supine   Quad Sets 10 reps;Strengthening;Both    Short Arc [employer] Right;10 reps    Heel Slides 10 reps;AAROM;Right    Knee Extension AROM    Knee Extension Limitations 15    Knee Flexion AROM    Knee Flexion Limitations 78      Manual Therapy   Manual Therapy Soft tissue mobilization    Manual therapy comments completed seperate from all other skilled interventions    Soft tissue mobilization Rt knee, quad and ITB to loosen tight mm and edema                  PT  Education - 07/02/20 1319    Education Details see below assessment    Person(s) Educated Patient    Methods Explanation    Comprehension Verbalized understanding            PT Short Term Goals - 06/28/20 1415      PT SHORT TERM GOAL #1   Title Patient will demonstrate at least 5-110 degrees of right knee ROM    Time 3    Period Weeks    Status New    Target Date 07/19/20      PT SHORT TERM GOAL #2   Title Patient will report at least 50% improvement in overall symptoms and/or function to demonstrate improved functional mobility    Time 3    Period Weeks    Status New    Target Date 07/19/20      PT SHORT TERM GOAL #3   Title Patient will be independent in self management strategies to improve quality of life and functional outcomes.    Time 3    Period Weeks  Status New    Target Date 07/19/20             PT Long Term Goals - 06/28/20 1416      PT LONG TERM GOAL #1   Title Patient will be able to ambulate at least 300 feet without an assistive device in 2 minutes to demonstrate improved walking mobility    Time 6    Period Weeks    Status New    Target Date 08/09/20      PT LONG TERM GOAL #2   Title Patient will improve on FOTO score to meet predicted outcomes to demonstrate improved functional mobility.    Time 6    Period Weeks    Status New    Target Date 08/09/20      PT LONG TERM GOAL #3   Title Patient will report at least 75% improvement in overall symptoms and/or function to demonstrate improved functional mobility    Time 6    Period Weeks    Status New    Target Date 08/09/20      PT LONG TERM GOAL #4   Title Patient will be able to ascend and descend 4 stairs with use of railing as needed with reciprocal gait pattern to demonstrate improved stair navigation    Time 6    Period Weeks    Status New    Target Date 08/09/20                 Plan - 07/02/20 1311    Clinical Impression Statement Pt with noted pain and discomfort  when entering gym.  Knee flexed approx. 15 degrees with ambulation using RW with overall antalgic gait.  Began pt on bike to work on ROM.  Pt became tearful and reported frustration with pain, tightness in LE and inability to sleep at night or do anything for herself.  Spent majority of time educating and reassuring patient while completing manual to reduce pain and tightness in Rt LE.  Pt much improved by end of session with reduction of tightness, improved ambulation and increased confidence in progression of recovery.  Main tightness was in ITB due to compensation.  Educated on moving frequently throughout the day doing ankle pumps, heelslides, LAQ/SAQ, etc.  Pt with improved quad contraction today as noted with exercises.  ROM for flexion reduced to 78 degrees (was 90 at eval) and extension slightly improved at 15 degrees (was 18).  Did not have time this session to review goals.    Personal Factors and Comorbidities Fitness;Comorbidity 2;Comorbidity 1    Comorbidities hx right knee scope and lacking knee extension for the last couple years    Examination-Activity Limitations Bathing;Sit;Sleep;Bed Mobility;Squat;Bend;Locomotion Level;Dressing;Transfers;Toileting;Stand;Carry;Stairs    Examination-Participation Restrictions Meal Prep;Occupation;Driving;Community Activity;Cleaning;Shop    Stability/Clinical Decision Making Stable/Uncomplicated    Rehab Potential Good    PT Frequency 3x / week    PT Duration 6 weeks    PT Treatment/Interventions ADLs/Self Care Home Management;Aquatic Therapy;Cryotherapy;Electrical Stimulation;Traction;Moist Heat;Balance training;Therapeutic exercise;Therapeutic activities;Manual techniques;Stair training;Gait training;DME Instruction;Neuromuscular re-education;Patient/family education;Dry needling;Passive range of motion;Scar mobilization;Joint Manipulations    PT Next Visit Plan edema management, quad activation, knee ROM    PT Home Exercise Plan heel slides, quad sets,  continueous ROM, elevation    Consulted and Agree with Plan of Care Patient           Patient will benefit from skilled therapeutic intervention in order to improve the following deficits and impairments:  Pain,Decreased mobility,Difficulty walking,Decreased range of motion,Decreased  strength,Decreased activity tolerance,Decreased endurance,Decreased skin integrity,Decreased scar mobility,Increased edema,Decreased knowledge of use of DME  Visit Diagnosis: Difficulty in walking, not elsewhere classified  Muscle weakness (generalized)  Chronic pain of right knee  Decreased range of motion (ROM) of right knee     Problem List There are no problems to display for this patient.  Lurena Nida, PTA/CLT [phone]  Lurena Nida 07/02/2020, 1:20 PM  [facility] [address] [place], [place], [address] Phone: [phone]   Fax:  [phone]  Name: [name] MRN: [id number] Date of Birth: 1965-03-29

## 2020-07-04 ENCOUNTER — Ambulatory Visit (HOSPITAL_COMMUNITY): Payer: Managed Care, Other (non HMO)

## 2020-07-04 ENCOUNTER — Encounter (HOSPITAL_COMMUNITY): Payer: Self-pay

## 2020-07-04 ENCOUNTER — Other Ambulatory Visit: Payer: Self-pay

## 2020-07-04 DIAGNOSIS — M6281 Muscle weakness (generalized): Secondary | ICD-10-CM

## 2020-07-04 DIAGNOSIS — R262 Difficulty in walking, not elsewhere classified: Secondary | ICD-10-CM | POA: Diagnosis not present

## 2020-07-04 DIAGNOSIS — M25661 Stiffness of right knee, not elsewhere classified: Secondary | ICD-10-CM

## 2020-07-04 DIAGNOSIS — G8929 Other chronic pain: Secondary | ICD-10-CM

## 2020-07-04 NOTE — Therapy (Signed)
[facility] [address] [place], [place], [address] Phone: [phone]   Fax:  [phone]  Physical Therapy Treatment  Patient Details  Name: [name] MRN: [id number] Date of Birth: 1965-06-24 Referring Provider (PT): Carroll Sage MD   Encounter Date: 07/04/2020   PT End of Session - 07/04/20 0825    Visit Number 3    Number of Visits 18    Date for PT Re-Evaluation 08/09/20    Authorization Type cigna managed no auth, 20 VL    Authorization - Visit Number 3    Authorization - Number of Visits 20    Progress Note Due on Visit 10    PT Start Time 0820    PT Stop Time 0900    PT Time Calculation (min) 40 min    Activity Tolerance Patient tolerated treatment well    Behavior During Therapy [facility] for tasks assessed/performed           Past Medical History:  Diagnosis Date  . Autoimmune hepatitis [facility])     Past Surgical History:  Procedure Laterality Date  . ABDOMINAL HYSTERECTOMY    . CHOLECYSTECTOMY    . KNEE SURGERY Bilateral   . nerve root compression      There were no vitals filed for this visit.   Subjective Assessment - 07/04/20 0824    Subjective Pt notes more stiffness than pain in her right knee    Pertinent History of right knee scope in 2017, lacking knee extension for years on right    Patient Stated Goals to be able to walk without walker    Currently in Pain? Yes    Pain Score 2     Pain Location Knee    Pain Orientation Right    Pain Descriptors / Indicators Aching;Sore    Pain Type Surgical pain              [facility] PT Assessment - 07/04/20 0001      Assessment   Medical Diagnosis R TKA    Referring Provider (PT) Carroll Sage MD                         [facility] Adult PT Treatment/Exercise - 07/04/20 0001      Knee/Hip Exercises: Stretches   Gastroc Stretch Both;2 reps;60 seconds   slantboard     Knee/Hip Exercises: Machines for Strengthening   Cybex Knee  Extension 4 plates, 2D74 reps    Total Gym Leg Press 2x10 at 26 degree incline      Knee/Hip Exercises: Standing   Lunge Walking - Round Trips lunge stretch on 6" step 2x10 3 sec hold    Other Standing Knee Exercises sidestepping x 2 min      Knee/Hip Exercises: Seated   Heel Slides AROM;Right   x2 min   Other Seated Knee/Hip Exercises seated QS x 2 min with massage gun over lateral thigh      Knee/Hip Exercises: Supine   Quad Sets Strengthening;3 sets;10 reps    Quad Sets Limitations 3 sec hold    Heel Slides AAROM;Right;3 sets;10 reps    Heel Slides Limitations 3" hold    Knee Extension AROM    Knee Extension Limitations 14   lacking   Knee Flexion AROM    Knee Flexion Limitations 80                    PT Short Term Goals -  06/28/20 1415      PT SHORT TERM GOAL #1   Title Patient will demonstrate at least 5-110 degrees of right knee ROM    Time 3    Period Weeks    Status New    Target Date 07/19/20      PT SHORT TERM GOAL #2   Title Patient will report at least 50% improvement in overall symptoms and/or function to demonstrate improved functional mobility    Time 3    Period Weeks    Status New    Target Date 07/19/20      PT SHORT TERM GOAL #3   Title Patient will be independent in self management strategies to improve quality of life and functional outcomes.    Time 3    Period Weeks    Status New    Target Date 07/19/20             PT Long Term Goals - 06/28/20 1416      PT LONG TERM GOAL #1   Title Patient will be able to ambulate at least 300 feet without an assistive device in 2 minutes to demonstrate improved walking mobility    Time 6    Period Weeks    Status New    Target Date 08/09/20      PT LONG TERM GOAL #2   Title Patient will improve on FOTO score to meet predicted outcomes to demonstrate improved functional mobility.    Time 6    Period Weeks    Status New    Target Date 08/09/20      PT LONG TERM GOAL #3   Title  Patient will report at least 75% improvement in overall symptoms and/or function to demonstrate improved functional mobility    Time 6    Period Weeks    Status New    Target Date 08/09/20      PT LONG TERM GOAL #4   Title Patient will be able to ascend and descend 4 stairs with use of railing as needed with reciprocal gait pattern to demonstrate improved stair navigation    Time 6    Period Weeks    Status New    Target Date 08/09/20                 Plan - 07/04/20 0850    Clinical Impression Statement tolerating activities with improved capabilities requiring less rest periods and pt feels that she is more tolerant for activities and feels more adjusted for the healing process but demo poor tolerance for flexion ROM. Continued POC indicated to progress right knee ROM/strength to facilitate normalized gait pattern    Personal Factors and Comorbidities Fitness;Comorbidity 2;Comorbidity 1    Comorbidities hx right knee scope and lacking knee extension for the last couple years    Examination-Activity Limitations Bathing;Sit;Sleep;Bed Mobility;Squat;Bend;Locomotion Level;Dressing;Transfers;Toileting;Stand;Carry;Stairs    Examination-Participation Restrictions Meal Prep;Occupation;Driving;Community Activity;Cleaning;Shop    Stability/Clinical Decision Making Stable/Uncomplicated    Rehab Potential Good    PT Frequency 3x / week    PT Duration 6 weeks    PT Treatment/Interventions ADLs/Self Care Home Management;Aquatic Therapy;Cryotherapy;Electrical Stimulation;Traction;Moist Heat;Balance training;Therapeutic exercise;Therapeutic activities;Manual techniques;Stair training;Gait training;DME Instruction;Neuromuscular re-education;Patient/family education;Dry needling;Passive range of motion;Scar mobilization;Joint Manipulations    PT Next Visit Plan edema management, quad activation, knee ROM    PT Home Exercise Plan heel slides, quad sets, continueous ROM, elevation, prone knee hang     Consulted and Agree with Plan of Care Patient  Patient will benefit from skilled therapeutic intervention in order to improve the following deficits and impairments:  Pain,Decreased mobility,Difficulty walking,Decreased range of motion,Decreased strength,Decreased activity tolerance,Decreased endurance,Decreased skin integrity,Decreased scar mobility,Increased edema,Decreased knowledge of use of DME  Visit Diagnosis: Difficulty in walking, not elsewhere classified  Muscle weakness (generalized)  Chronic pain of right knee  Decreased range of motion (ROM) of right knee     Problem List There are no problems to display for this patient.  9:06 AM, 07/04/20 M. Shary Decamp, PT, DPT Physical Therapist- [place] Office Number: [phone]  [facility] [address] [place], [place], [address] Phone: [phone]   Fax:  [phone]  Name: Kyri Shader MRN: [id number] Date of Birth: [birth year]

## 2020-07-04 NOTE — Patient Instructions (Signed)
Access Code: [email or web address] URL: [email or web address] Date: 07/04/2020 Prepared by: Shary Decamp  Exercises Prone Knee Extension Hang - 1 x daily - 7 x weekly - 3 sets - 3 reps - 1-2 min hold

## 2020-07-05 ENCOUNTER — Encounter (HOSPITAL_COMMUNITY): Payer: Managed Care, Other (non HMO)

## 2020-07-10 ENCOUNTER — Encounter (HOSPITAL_COMMUNITY): Payer: Managed Care, Other (non HMO) | Admitting: Physical Therapy

## 2020-07-11 ENCOUNTER — Other Ambulatory Visit: Payer: Self-pay

## 2020-07-11 ENCOUNTER — Ambulatory Visit (HOSPITAL_COMMUNITY): Payer: Managed Care, Other (non HMO) | Attending: Physician Assistant | Admitting: Physical Therapy

## 2020-07-11 DIAGNOSIS — M25661 Stiffness of right knee, not elsewhere classified: Secondary | ICD-10-CM | POA: Diagnosis present

## 2020-07-11 DIAGNOSIS — M6281 Muscle weakness (generalized): Secondary | ICD-10-CM

## 2020-07-11 DIAGNOSIS — R262 Difficulty in walking, not elsewhere classified: Secondary | ICD-10-CM | POA: Diagnosis not present

## 2020-07-11 DIAGNOSIS — G8929 Other chronic pain: Secondary | ICD-10-CM

## 2020-07-11 DIAGNOSIS — M25561 Pain in right knee: Secondary | ICD-10-CM | POA: Diagnosis present

## 2020-07-11 NOTE — Therapy (Signed)
[facility] [address] [place], [place], [address] Phone: [phone]   Fax:  [phone]  Physical Therapy Treatment  Patient Details  Name: [name] MRN: [id number] Date of Birth: 11/02/1965 Referring Provider (PT): Carroll Sage MD   Encounter Date: 07/11/2020   PT End of Session - 07/11/20 1723    Visit Number 4    Number of Visits 18    Date for PT Re-Evaluation 08/09/20    Authorization Type cigna managed no auth, 20 VL    Authorization - Visit Number 4    Authorization - Number of Visits 20    Progress Note Due on Visit 10    PT Start Time 1618    PT Stop Time 1704    PT Time Calculation (min) 46 min    Activity Tolerance Patient tolerated treatment well    Behavior During Therapy [facility] for tasks assessed/performed           Past Medical History:  Diagnosis Date  . Autoimmune hepatitis [facility])     Past Surgical History:  Procedure Laterality Date  . ABDOMINAL HYSTERECTOMY    . CHOLECYSTECTOMY    . KNEE SURGERY Bilateral   . nerve root compression      There were no vitals filed for this visit.   Subjective Assessment - 07/11/20 1625    Subjective pt states she went to MD yesterday and he was overall pleased with progress.  STates he gave her a mm relaxer and can tell this has already helped.  pt reports she is feeling much better about her recovery.    Currently in Pain? No/denies                             [facility] Adult PT Treatment/Exercise - 07/11/20 0001      Knee/Hip Exercises: Stretches   Active Hamstring Stretch Right;10 seconds    Knee: Self-Stretch to increase Flexion Right;10 seconds    Knee: Self-Stretch Limitations 10 reps on 12" step    Gastroc Stretch 5 reps;30 seconds    Gastroc Stretch Limitations slant board      Knee/Hip Exercises: Aerobic   Stationary Bike 5 minutes rocking      Knee/Hip Exercises: Standing   Gait Training [id number] feet; no AD X50'       Knee/Hip Exercises: Seated   Heel Slides AROM;Right    Heel Slides Limitations AAROM also    Other Seated Knee/Hip Exercises heel prop on ottoman      Knee/Hip Exercises: Supine   Quad Sets Strengthening;3 sets;10 reps    Quad Sets Limitations 3" hold    Short Arc [employer] Right;10 reps    Short Arc Quad Sets Limitations 3" hold    Heel Slides AAROM;Right;3 sets;10 reps    Heel Slides Limitations 3" hold    Knee Extension AROM    Knee Extension Limitations 9    Knee Flexion AROM    Knee Flexion Limitations 92   after manual; before 85 degrees     Knee/Hip Exercises: Prone   Prone Knee Hang 1 minute    Prone Knee Hang Limitations shown for completing at home      Manual Therapy   Manual Therapy Soft tissue mobilization    Manual therapy comments completed seperate from all other skilled interventions    Soft tissue mobilization Rt knee, quad and ITB to loosen tight mm and edema  PT Short Term Goals - 06/28/20 1415      PT SHORT TERM GOAL #1   Title Patient will demonstrate at least 5-110 degrees of right knee ROM    Time 3    Period Weeks    Status New    Target Date 07/19/20      PT SHORT TERM GOAL #2   Title Patient will report at least 50% improvement in overall symptoms and/or function to demonstrate improved functional mobility    Time 3    Period Weeks    Status New    Target Date 07/19/20      PT SHORT TERM GOAL #3   Title Patient will be independent in self management strategies to improve quality of life and functional outcomes.    Time 3    Period Weeks    Status New    Target Date 07/19/20             PT Long Term Goals - 06/28/20 1416      PT LONG TERM GOAL #1   Title Patient will be able to ambulate at least 300 feet without an assistive device in 2 minutes to demonstrate improved walking mobility    Time 6    Period Weeks    Status New    Target Date 08/09/20      PT LONG TERM GOAL #2   Title Patient will  improve on FOTO score to meet predicted outcomes to demonstrate improved functional mobility.    Time 6    Period Weeks    Status New    Target Date 08/09/20      PT LONG TERM GOAL #3   Title Patient will report at least 75% improvement in overall symptoms and/or function to demonstrate improved functional mobility    Time 6    Period Weeks    Status New    Target Date 08/09/20      PT LONG TERM GOAL #4   Title Patient will be able to ascend and descend 4 stairs with use of railing as needed with reciprocal gait pattern to demonstrate improved stair navigation    Time 6    Period Weeks    Status New    Target Date 08/09/20                 Plan - 07/11/20 1723    Clinical Impression Statement Pt is s/p 2 weeks post Rt TKA.  entered clinic in much better spirits as compared to last week; new med just taken prior to appt and is helping ease discomfort.  No antalgia noted with ambulation.  Progressed to gait using SPC with good cadence, sequencing and no pain reported.  Encouraged to begin using this for community ambulation.  Added hamstring and gastroc stretch to help lengthen posterior LE mm.  Pt able to obtain good stretch with reports of stretching feeling great to LE's.  Manual completed to knee due to visible adhesions/puckering of skin due to edema and tightness.  Much improved glide of tissue following and increased ROM to 92 degrees.  Encouraged to complete knee hangs or props to work on extension as well as self massage to prevent adhesions.    Personal Factors and Comorbidities Fitness;Comorbidity 2;Comorbidity 1    Comorbidities hx right knee scope and lacking knee extension for the last couple years    Examination-Activity Limitations Bathing;Sit;Sleep;Bed Mobility;Squat;Bend;Locomotion Level;Dressing;Transfers;Toileting;Stand;Carry;Stairs    Examination-Participation Restrictions Meal Prep;Occupation;Driving;Community Activity;Cleaning;Shop    Stability/Clinical Decision  Making Stable/Uncomplicated    Rehab Potential Good    PT Frequency 3x / week    PT Duration 6 weeks    PT Treatment/Interventions ADLs/Self Care Home Management;Aquatic Therapy;Cryotherapy;Electrical Stimulation;Traction;Moist Heat;Balance training;Therapeutic exercise;Therapeutic activities;Manual techniques;Stair training;Gait training;DME Instruction;Neuromuscular re-education;Patient/family education;Dry needling;Passive range of motion;Scar mobilization;Joint Manipulations    PT Next Visit Plan Continue with focus on improving ROM with goal of 0-120 per MD then progress on to strengthening/functional strengthening.    PT Home Exercise Plan heel slides, quad sets, continueous ROM, elevation, prone knee hang    Consulted and Agree with Plan of Care Patient           Patient will benefit from skilled therapeutic intervention in order to improve the following deficits and impairments:  Pain,Decreased mobility,Difficulty walking,Decreased range of motion,Decreased strength,Decreased activity tolerance,Decreased endurance,Decreased skin integrity,Decreased scar mobility,Increased edema,Decreased knowledge of use of DME  Visit Diagnosis: Difficulty in walking, not elsewhere classified  Muscle weakness (generalized)  Chronic pain of right knee  Decreased range of motion (ROM) of right knee     Problem List There are no problems to display for this patient.  Lurena Nida, PTA/CLT [phone]  Lurena Nida 07/11/2020, 5:28 PM   [facility] [address] [place], [place], [address] Phone: [phone]   Fax:  [phone]  Name: Shaneal Barasch MRN: [id number] Date of Birth: 01/06/1966

## 2020-07-13 ENCOUNTER — Other Ambulatory Visit: Payer: Self-pay

## 2020-07-13 ENCOUNTER — Ambulatory Visit (HOSPITAL_COMMUNITY): Payer: Managed Care, Other (non HMO) | Admitting: Physical Therapy

## 2020-07-13 DIAGNOSIS — R262 Difficulty in walking, not elsewhere classified: Secondary | ICD-10-CM | POA: Diagnosis not present

## 2020-07-13 DIAGNOSIS — M6281 Muscle weakness (generalized): Secondary | ICD-10-CM

## 2020-07-13 DIAGNOSIS — M25661 Stiffness of right knee, not elsewhere classified: Secondary | ICD-10-CM

## 2020-07-13 DIAGNOSIS — G8929 Other chronic pain: Secondary | ICD-10-CM

## 2020-07-13 NOTE — Therapy (Signed)
[facility] [address] [place], [place], [address] Phone: [phone]   Fax:  [phone]  Physical Therapy Treatment  Patient Details  Name: [name] MRN: [id number] Date of Birth: [birth year] Referring Provider (PT): Carroll Sage MD   Encounter Date: 07/13/2020   PT End of Session - 07/13/20 1203    Visit Number 5    Number of Visits 18    Date for PT Re-Evaluation 08/09/20    Authorization Type cigna managed no auth, 20 VL    Authorization - Visit Number 5    Authorization - Number of Visits 20    Progress Note Due on Visit 10    PT Start Time 1138    PT Stop Time 1218    PT Time Calculation (min) 40 min    Activity Tolerance Patient tolerated treatment well    Behavior During Therapy [facility] for tasks assessed/performed           Past Medical History:  Diagnosis Date  . Autoimmune hepatitis [facility])     Past Surgical History:  Procedure Laterality Date  . ABDOMINAL HYSTERECTOMY    . CHOLECYSTECTOMY    . KNEE SURGERY Bilateral   . nerve root compression      There were no vitals filed for this visit.   Subjective Assessment - 07/13/20 1138    Subjective Pt states that she has been using her cane.  She is feeling pretty good, a little sore but not bad.    Currently in Pain? Yes    Pain Score 2     Pain Location Knee    Pain Orientation Right    Pain Descriptors / Indicators Sore    Pain Type Surgical pain                             [facility] Adult PT Treatment/Exercise - 07/13/20 0001      Knee/Hip Exercises: Stretches   Active Hamstring Stretch Right;3 reps;20 seconds    Gastroc Stretch Right;3 reps;20 seconds    Gastroc Stretch Limitations wall      Knee/Hip Exercises: Standing   Heel Raises Both;10 reps    Knee Flexion Strengthening;Right;10 reps    Terminal Knee Extension Right;10 reps    Rocker Board 2 minutes      Knee/Hip Exercises: Seated   Long Arc Quad Right;10 reps     Heel Slides AROM;Right      Knee/Hip Exercises: Supine   Quad Sets Strengthening;3 sets;10 reps    Short Arc Quad Sets Right;10 reps    Heel Slides AAROM;Right;3 sets;10 reps    Straight Leg Raises AROM;Right;10 reps    Knee Extension AROM    Knee Extension Limitations 11    Knee Flexion AROM                    PT Short Term Goals - 07/13/20 1218      PT SHORT TERM GOAL #1   Title Patient will demonstrate at least 5-110 degrees of right knee ROM    Time 3    Period Weeks    Status New    Target Date 07/19/20      PT SHORT TERM GOAL #2   Title Patient will report at least 50% improvement in overall symptoms and/or function to demonstrate improved functional mobility    Time 3    Period Weeks    Status On-going  Target Date 07/19/20      PT SHORT TERM GOAL #3   Title Patient will be independent in self management strategies to improve quality of life and functional outcomes.    Time 3    Period Weeks    Status On-going    Target Date 07/19/20             PT Long Term Goals - 07/13/20 1218      PT LONG TERM GOAL #1   Title Patient will be able to ambulate at least 300 feet without an assistive device in 2 minutes to demonstrate improved walking mobility    Time 6    Period Weeks    Status On-going      PT LONG TERM GOAL #2   Title Patient will improve on FOTO score to meet predicted outcomes to demonstrate improved functional mobility.    Time 6    Period Weeks    Status On-going      PT LONG TERM GOAL #3   Title Patient will report at least 75% improvement in overall symptoms and/or function to demonstrate improved functional mobility    Time 6    Period Weeks    Status On-going      PT LONG TERM GOAL #4   Title Patient will be able to ascend and descend 4 stairs with use of railing as needed with reciprocal gait pattern to demonstrate improved stair navigation    Time 6    Period Weeks    Status On-going                 Plan -  07/13/20 1204    Clinical Impression Statement Pt comes to department walking with a cane with noted knee flextion at approximately 15 degrees when walking.  Worked on heel toe gait pattern.  Therapist discussed a very gentle scar massage to reduce tissue tension.    Pt treatment will continue to focus mainly on ROM until range has improved.  UpdateHEp    Personal Factors and Comorbidities Fitness;Comorbidity 2;Comorbidity 1    Comorbidities hx right knee scope and lacking knee extension for the last couple years    Examination-Activity Limitations Bathing;Sit;Sleep;Bed Mobility;Squat;Bend;Locomotion Level;Dressing;Transfers;Toileting;Stand;Carry;Stairs    Examination-Participation Restrictions Meal Prep;Occupation;Driving;Community Activity;Cleaning;Shop    Stability/Clinical Decision Making Stable/Uncomplicated    Rehab Potential Good    PT Frequency 3x / week    PT Duration 6 weeks    PT Treatment/Interventions ADLs/Self Care Home Management;Aquatic Therapy;Cryotherapy;Electrical Stimulation;Traction;Moist Heat;Balance training;Therapeutic exercise;Therapeutic activities;Manual techniques;Stair training;Gait training;DME Instruction;Neuromuscular re-education;Patient/family education;Dry needling;Passive range of motion;Scar mobilization;Joint Manipulations    PT Next Visit Plan Continue with focus on improving ROM with goal of 0-120 per MD then progress on to strengthening/functional strengthening.    PT Home Exercise Plan heel slides, quad sets, continueous ROM, elevation, prone knee hang; 6/3: heelraise, standing knee flexion, standing terminal extension and SAQ    Consulted and Agree with Plan of Care Patient           Patient will benefit from skilled therapeutic intervention in order to improve the following deficits and impairments:  Pain,Decreased mobility,Difficulty walking,Decreased range of motion,Decreased strength,Decreased activity tolerance,Decreased endurance,Decreased skin  integrity,Decreased scar mobility,Increased edema,Decreased knowledge of use of DME  Visit Diagnosis: Difficulty in walking, not elsewhere classified  Muscle weakness (generalized)  Chronic pain of right knee  Decreased range of motion (ROM) of right knee     Problem List There are no problems to display for this patient.  Virgina Organ,  PT CLT [phone] 07/13/2020, 12:19 PM  [facility] [address] [place], [place], [address] Phone: [phone]   Fax:  [phone]  Name: [name] MRN: [id number] Date of Birth: 12/16/1965

## 2020-07-17 ENCOUNTER — Encounter (HOSPITAL_COMMUNITY): Payer: Managed Care, Other (non HMO)

## 2020-07-18 ENCOUNTER — Other Ambulatory Visit: Payer: Self-pay

## 2020-07-18 ENCOUNTER — Ambulatory Visit (HOSPITAL_COMMUNITY): Payer: Managed Care, Other (non HMO)

## 2020-07-18 ENCOUNTER — Encounter (HOSPITAL_COMMUNITY): Payer: Self-pay

## 2020-07-18 DIAGNOSIS — R262 Difficulty in walking, not elsewhere classified: Secondary | ICD-10-CM | POA: Diagnosis not present

## 2020-07-18 DIAGNOSIS — M6281 Muscle weakness (generalized): Secondary | ICD-10-CM

## 2020-07-18 DIAGNOSIS — M25661 Stiffness of right knee, not elsewhere classified: Secondary | ICD-10-CM

## 2020-07-18 DIAGNOSIS — G8929 Other chronic pain: Secondary | ICD-10-CM

## 2020-07-18 NOTE — Therapy (Signed)
[facility] [address] [place], [place], [address] Phone: [phone]   Fax:  [phone]  Physical Therapy Treatment  Patient Details  Name: [name] MRN: [id number] Date of Birth: Dec 01, 1965 Referring Provider (PT): Carroll Sage MD   Encounter Date: 07/18/2020   PT End of Session - 07/18/20 0840    Visit Number 6    Number of Visits 18    Date for PT Re-Evaluation 08/09/20    Authorization Type cigna managed no auth, 20 VL    Authorization - Visit Number 6    Authorization - Number of Visits 20    Progress Note Due on Visit 10    PT Start Time [id number]    PT Stop Time 0913    PT Time Calculation (min) 38 min    Activity Tolerance Patient tolerated treatment well    Behavior During Therapy [facility] for tasks assessed/performed           Past Medical History:  Diagnosis Date  . Autoimmune hepatitis [facility])     Past Surgical History:  Procedure Laterality Date  . ABDOMINAL HYSTERECTOMY    . CHOLECYSTECTOMY    . KNEE SURGERY Bilateral   . nerve root compression      There were no vitals filed for this visit.   Subjective Assessment - 07/18/20 0834    Subjective Pt arrived wihtout cane, reports she is feeling good today.  No real pain, is a little sore but not bad.    Pertinent History of right knee scope in 2017, lacking knee extension for years on right    Patient Stated Goals to be able to walk without walker    Currently in Pain? No/denies    Pain Descriptors / Indicators Sore                             [facility] Adult PT Treatment/Exercise - 07/18/20 0001      Knee/Hip Exercises: Stretches   Gastroc Stretch 3 reps;30 seconds    Gastroc Stretch Limitations slant board      Knee/Hip Exercises: Standing   Heel Raises Both;15 reps    Heel Raises Limitations incline slope; toe raises on decline slope    Terminal Knee Extension Right;15 reps;Theraband    Theraband Level (Terminal Knee Extension)  Level 3 (Green)    Gait Training Heel to toe mechanics through session.      Knee/Hip Exercises: Supine   Quad Sets Strengthening;3 sets;10 reps    Short Arc [employer] Right;15 reps    Short Arc Quad Sets Limitations 5" holds    Heel Slides AAROM;Right;3 sets;10 reps    Heel Slides Limitations 3" hold    Straight Leg Raises AROM;Right;10 reps    Knee Extension AROM    Knee Extension Limitations 10    Knee Flexion AROM    Knee Flexion Limitations 98    Other Supine Knee/Hip Exercises PROM flexion and extension 2x 30"      Manual Therapy   Manual Therapy Myofascial release    Manual therapy comments Manual complete separate than rest of tx    Myofascial Release scar tissue                    PT Short Term Goals - 07/13/20 1218      PT SHORT TERM GOAL #1   Title Patient will demonstrate at least 5-110 degrees of right knee ROM  Time 3    Period Weeks    Status New    Target Date 07/19/20      PT SHORT TERM GOAL #2   Title Patient will report at least 50% improvement in overall symptoms and/or function to demonstrate improved functional mobility    Time 3    Period Weeks    Status On-going    Target Date 07/19/20      PT SHORT TERM GOAL #3   Title Patient will be independent in self management strategies to improve quality of life and functional outcomes.    Time 3    Period Weeks    Status On-going    Target Date 07/19/20             PT Long Term Goals - 07/13/20 1218      PT LONG TERM GOAL #1   Title Patient will be able to ambulate at least 300 feet without an assistive device in 2 minutes to demonstrate improved walking mobility    Time 6    Period Weeks    Status On-going      PT LONG TERM GOAL #2   Title Patient will improve on FOTO score to meet predicted outcomes to demonstrate improved functional mobility.    Time 6    Period Weeks    Status On-going      PT LONG TERM GOAL #3   Title Patient will report at least 75% improvement in  overall symptoms and/or function to demonstrate improved functional mobility    Time 6    Period Weeks    Status On-going      PT LONG TERM GOAL #4   Title Patient will be able to ascend and descend 4 stairs with use of railing as needed with reciprocal gait pattern to demonstrate improved stair navigation    Time 6    Period Weeks    Status On-going                 Plan - 07/18/20 0908    Clinical Impression Statement Pt arrived ambulating no AD.  Encouraged heel strike to improve extension with gait.  Manual scar tissue massage complete as well as additional PROM both directions.  AROM 10-98degrees.  Pt tolerated well to session, no reports of increased pain through session.    Personal Factors and Comorbidities Fitness;Comorbidity 2;Comorbidity 1    Comorbidities hx right knee scope and lacking knee extension for the last couple years    Examination-Activity Limitations Bathing;Sit;Sleep;Bed Mobility;Squat;Bend;Locomotion Level;Dressing;Transfers;Toileting;Stand;Carry;Stairs    Examination-Participation Restrictions Meal Prep;Occupation;Driving;Community Activity;Cleaning;Shop    Stability/Clinical Decision Making Stable/Uncomplicated    Clinical Decision Making Low    Rehab Potential Good    PT Frequency 3x / week    PT Duration 6 weeks    PT Treatment/Interventions ADLs/Self Care Home Management;Aquatic Therapy;Cryotherapy;Electrical Stimulation;Traction;Moist Heat;Balance training;Therapeutic exercise;Therapeutic activities;Manual techniques;Stair training;Gait training;DME Instruction;Neuromuscular re-education;Patient/family education;Dry needling;Passive range of motion;Scar mobilization;Joint Manipulations    PT Next Visit Plan Continue with focus on improving ROM with goal of 0-120 per MD then progress on to strengthening/functional strengthening.  Next session attempted contract/relax to improve flexion and continue quad strengthening to improve extension.    PT Home  Exercise Plan heel slides, quad sets, continueous ROM, elevation, prone knee hang; 6/3: heelraise, standing knee flexion, standing terminal extension and SAQ    Consulted and Agree with Plan of Care Patient           Patient will benefit from skilled therapeutic intervention  in order to improve the following deficits and impairments:  Pain,Decreased mobility,Difficulty walking,Decreased range of motion,Decreased strength,Decreased activity tolerance,Decreased endurance,Decreased skin integrity,Decreased scar mobility,Increased edema,Decreased knowledge of use of DME  Visit Diagnosis: Difficulty in walking, not elsewhere classified  Muscle weakness (generalized)  Chronic pain of right knee  Decreased range of motion (ROM) of right knee     Problem List There are no problems to display for this patient.  Becky Sax, LPTA/CLT; CBIS [phone]  Juel Burrow 07/18/2020, 4:25 PM  [facility] [place] [address] [place], [place], [address] Phone: [phone]   Fax:  [phone]  Name: Charina Fons MRN: [id number] Date of Birth: 1965-03-31

## 2020-07-19 ENCOUNTER — Ambulatory Visit (HOSPITAL_COMMUNITY): Payer: Managed Care, Other (non HMO)

## 2020-07-19 ENCOUNTER — Encounter (HOSPITAL_COMMUNITY): Payer: Self-pay

## 2020-07-19 DIAGNOSIS — R262 Difficulty in walking, not elsewhere classified: Secondary | ICD-10-CM

## 2020-07-19 DIAGNOSIS — G8929 Other chronic pain: Secondary | ICD-10-CM

## 2020-07-19 DIAGNOSIS — M25561 Pain in right knee: Secondary | ICD-10-CM

## 2020-07-19 DIAGNOSIS — M25661 Stiffness of right knee, not elsewhere classified: Secondary | ICD-10-CM

## 2020-07-19 DIAGNOSIS — M6281 Muscle weakness (generalized): Secondary | ICD-10-CM

## 2020-07-19 NOTE — Patient Instructions (Signed)
Terminal Knee Extension    Lying on back with rolled towel (about 6 inches wide) under knee, slowly straighten knee to fully extended position.  Hold 10 seconds, then relax. Repeat with other knee. Repeat 10 times. Do 3 sessions per day.  http://gt2.exer.us/260   Copyright  VHI. All rights reserved.

## 2020-07-19 NOTE — Therapy (Signed)
[facility] [address] [place], [place], [address] Phone: [phone]   Fax:  [phone]  Physical Therapy Treatment  Patient Details  Name: [name] MRN: [id number] Date of Birth: 1965/03/06 Referring Provider (PT): Carroll Sage MD   Encounter Date: 07/19/2020   PT End of Session - 07/19/20 1626     Visit Number 7    Number of Visits 18    Date for PT Re-Evaluation 08/09/20    Authorization Type cigna managed no auth, 20 VL    Authorization - Visit Number 7    Authorization - Number of Visits 20    Progress Note Due on Visit 10    PT Start Time 1620    PT Stop Time 1707    PT Time Calculation (min) 47 min    Activity Tolerance Patient tolerated treatment well    Behavior During Therapy [facility] for tasks assessed/performed             Past Medical History:  Diagnosis Date   Autoimmune hepatitis (HCC)     Past Surgical History:  Procedure Laterality Date   ABDOMINAL HYSTERECTOMY     CHOLECYSTECTOMY     KNEE SURGERY Bilateral    nerve root compression      There were no vitals filed for this visit.   Subjective Assessment - 07/19/20 1624     Subjective Pt stated knee is stiff today, no reports of pain currently.  Has been trying to focus on heel strike while walking.  Has been rubbing scar regularly as well.    Pertinent History of right knee scope in 2017, lacking knee extension for years on right    Patient Stated Goals to be able to walk without walker    Currently in Pain? No/denies    Pain Descriptors / Indicators Tightness    Pain Type Surgical pain                               [facility] Adult PT Treatment/Exercise - 07/19/20 0001       Knee/Hip Exercises: Stretches   Active Hamstring Stretch Right;3 reps;30 seconds    Active Hamstring Stretch Limitations standing with 12in step    Quad Stretch 2 reps;30 seconds    Quad Stretch Limitations prone with rope    Knee:  Self-Stretch to increase Flexion Right;10 seconds    Knee: Self-Stretch Limitations 5 reps on 12" step    Gastroc Stretch 3 reps;30 seconds    Gastroc Stretch Limitations slant board      Knee/Hip Exercises: Aerobic   Stationary Bike 5 minutes rocking seat 14      Knee/Hip Exercises: Standing   Heel Raises Limitations heel walking 2RT down blue oine    Terminal Knee Extension Right;15 reps;Theraband    Terminal Knee Extension Limitations [employer] 2Pl 10x 5"    Gait Training Retro/forward gait with TKE 2Pl 4RT      Knee/Hip Exercises: Supine   Short Arc Quad Sets Right;15 reps    Heel Slides AROM;Right;5 reps    Terminal Knee Extension 2 sets;5 reps    Terminal Knee Extension Limitations 5-10" holds    Knee Extension AROM    Knee Extension Limitations 10, able to acheive 8 degrees during TKE exercise wiht tactile cueing    Knee Flexion AROM    Knee Flexion Limitations 105      Knee/Hip Exercises: Prone   Contract/Relax to  Increase Flexion 3x 10" holds      Manual Therapy   Manual Therapy Joint mobilization;Soft tissue mobilization;Myofascial release    Manual therapy comments Manual complete separate than rest of tx    Joint Mobilization patella mobs all directions    Soft tissue mobilization Rt knee, quadricep    Myofascial Release scar tissue                      PT Short Term Goals - 07/13/20 1218       PT SHORT TERM GOAL #1   Title Patient will demonstrate at least 5-110 degrees of right knee ROM    Time 3    Period Weeks    Status New    Target Date 07/19/20      PT SHORT TERM GOAL #2   Title Patient will report at least 50% improvement in overall symptoms and/or function to demonstrate improved functional mobility    Time 3    Period Weeks    Status On-going    Target Date 07/19/20      PT SHORT TERM GOAL #3   Title Patient will be independent in self management strategies to improve quality of life and functional outcomes.    Time 3    Period  Weeks    Status On-going    Target Date 07/19/20               PT Long Term Goals - 07/13/20 1218       PT LONG TERM GOAL #1   Title Patient will be able to ambulate at least 300 feet without an assistive device in 2 minutes to demonstrate improved walking mobility    Time 6    Period Weeks    Status On-going      PT LONG TERM GOAL #2   Title Patient will improve on FOTO score to meet predicted outcomes to demonstrate improved functional mobility.    Time 6    Period Weeks    Status On-going      PT LONG TERM GOAL #3   Title Patient will report at least 75% improvement in overall symptoms and/or function to demonstrate improved functional mobility    Time 6    Period Weeks    Status On-going      PT LONG TERM GOAL #4   Title Patient will be able to ascend and descend 4 stairs with use of railing as needed with reciprocal gait pattern to demonstrate improved stair navigation    Time 6    Period Weeks    Status On-going                   Plan - 07/19/20 1650     Clinical Impression Statement Continued session focus with knee mobility.  Added gait associated exercises to improve extension with heel strike that was tolerated well.  Added stretches and contract relax to improve ROM.  Manual scar tissue massage, patella mobs and quad STM complete to reduce overall tightness.  Added TKE to HEP.  Improved AROM 8-105 degrees.    Personal Factors and Comorbidities Fitness;Comorbidity 2;Comorbidity 1    Comorbidities hx right knee scope and lacking knee extension for the last couple years    Examination-Activity Limitations Bathing;Sit;Sleep;Bed Mobility;Squat;Bend;Locomotion Level;Dressing;Transfers;Toileting;Stand;Carry;Stairs    Examination-Participation Restrictions Meal Prep;Occupation;Driving;Community Activity;Cleaning;Shop    Stability/Clinical Decision Making Stable/Uncomplicated    Clinical Decision Making Low    Rehab Potential Good    PT Frequency  3x / week     PT Duration 6 weeks    PT Treatment/Interventions ADLs/Self Care Home Management;Aquatic Therapy;Cryotherapy;Electrical Stimulation;Traction;Moist Heat;Balance training;Therapeutic exercise;Therapeutic activities;Manual techniques;Stair training;Gait training;DME Instruction;Neuromuscular re-education;Patient/family education;Dry needling;Passive range of motion;Scar mobilization;Joint Manipulations    PT Next Visit Plan Continue with focus on improving ROM with goal of 0-120 per MD then progress on to strengthening/functional strengthening.    PT Home Exercise Plan heel slides, quad sets, continueous ROM, elevation, prone knee hang; 6/3: heelraise, standing knee flexion, standing terminal extension and SAQ    Consulted and Agree with Plan of Care Patient             Patient will benefit from skilled therapeutic intervention in order to improve the following deficits and impairments:  Pain, Decreased mobility, Difficulty walking, Decreased range of motion, Decreased strength, Decreased activity tolerance, Decreased endurance, Decreased skin integrity, Decreased scar mobility, Increased edema, Decreased knowledge of use of DME  Visit Diagnosis: Difficulty in walking, not elsewhere classified  Muscle weakness (generalized)  Chronic pain of right knee  Decreased range of motion (ROM) of right knee     Problem List There are no problems to display for this patient.  Becky Sax, LPTA/CLT; CBIS [phone]  Juel Burrow 07/19/2020, 5:21 PM  [facility] [address] [place], [place], [address] Phone: [phone]   Fax:  [phone]  Name: [name] MRN: [id number] Date of Birth: [birth year]

## 2020-07-23 ENCOUNTER — Other Ambulatory Visit: Payer: Self-pay

## 2020-07-23 ENCOUNTER — Ambulatory Visit (HOSPITAL_COMMUNITY): Payer: Managed Care, Other (non HMO) | Admitting: Physical Therapy

## 2020-07-23 ENCOUNTER — Encounter (HOSPITAL_COMMUNITY): Payer: Self-pay | Admitting: Physical Therapy

## 2020-07-23 DIAGNOSIS — R262 Difficulty in walking, not elsewhere classified: Secondary | ICD-10-CM

## 2020-07-23 DIAGNOSIS — M25661 Stiffness of right knee, not elsewhere classified: Secondary | ICD-10-CM

## 2020-07-23 DIAGNOSIS — G8929 Other chronic pain: Secondary | ICD-10-CM

## 2020-07-23 DIAGNOSIS — M6281 Muscle weakness (generalized): Secondary | ICD-10-CM

## 2020-07-23 NOTE — Therapy (Signed)
[facility] [address] [place], [place], [address] Phone: [phone]   Fax:  [phone]  Physical Therapy Treatment and Progress Note  Patient Details  Name: [name] MRN: [id number] Date of Birth: [birth year] Referring Provider (PT): Ardyth Harps MD   Encounter Date: 07/23/2020   PT End of Session - 07/23/20 0747     Visit Number 8    Number of Visits 18    Date for PT Re-Evaluation 08/09/20    Authorization Type cigna managed no auth, 20 VL    Authorization - Visit Number 8    Authorization - Number of Visits 20    Progress Note Due on Visit 10    PT Start Time 3419    PT Stop Time 0827    PT Time Calculation (min) 40 min    Activity Tolerance Patient tolerated treatment well    Behavior During Therapy [facility] for tasks assessed/performed             Past Medical History:  Diagnosis Date   Autoimmune hepatitis ([place])     Past Surgical History:  Procedure Laterality Date   ABDOMINAL HYSTERECTOMY     CHOLECYSTECTOMY     KNEE SURGERY Bilateral    nerve root compression      There were no vitals filed for this visit.   Subjective Assessment - 07/23/20 0752     Subjective States that last night was tough and today has been tough. States she thinks she overdid it this weekend. States that she has been feeling really tight and some of her exercises have been painful. States she has been standing and walking around and thinks it has been doing well. States she is straightening at least as much as she was prior to surgery.    Pertinent History of right knee scope in 2017, lacking knee extension for years on right    Patient Stated Goals to be able to walk without walker    Currently in Pain? Yes    Pain Location Knee    Pain Orientation Right    Pain Descriptors / Indicators Aching    Pain Type Surgical pain                [facility] PT Assessment - 07/23/20 0001       Assessment   Medical Diagnosis R  TKA    Referring Provider (PT) Ardyth Harps MD    Next MD Visit 08/07/20      Observation/Other Assessments   Focus on Therapeutic Outcomes (FOTO)  52% function   predicted 49% function     Ambulation/Gait   Ambulation/Gait Yes    Ambulation/Gait Assistance 6: Modified independent (Device/Increase time)    Ambulation Distance (Feet) 402 Feet    Assistive device None    Gait Pattern Decreased hip/knee flexion - right;Trunk flexed    Ambulation Surface Level;Indoor    Gait velocity decreased    Stairs --    Gait Comments x5  on 4" steps - then x5 on 7" steps, altenrating, one railing                           [facility] Adult PT Treatment/Exercise - 07/23/20 0001       Knee/Hip Exercises: Stretches   Active Hamstring Stretch Right;3 reps;30 seconds      Knee/Hip Exercises: Standing   Other Standing Knee Exercises step up down on 4" step x15 R --> then  6" step forward step down x15 1 hand held intermittently      Knee/Hip Exercises: Seated   Sit to Sand without UE support   hands on thighs, 2 pieces of foam x5, 1 piece x5, then no foam 3x5     Knee/Hip Exercises: Supine   Heel Slides 10 reps;Right   10 holds in extension and flexion with strap   Knee Extension AROM    Knee Extension Limitations 10   lacking   Knee Flexion AROM    Knee Flexion Limitations 110                    PT Education - 07/23/20 0829     Education Details on FOTO, current presentation, POC and reducing to 2/x week    Person(s) Educated Patient    Methods Explanation    Comprehension Verbalized understanding              PT Short Term Goals - 07/23/20 0753       PT SHORT TERM GOAL #1   Title Patient will demonstrate at least 5-110 degrees of right knee ROM    Baseline 10-110    Time 3    Period Weeks    Status On-going    Target Date 07/19/20      PT SHORT TERM GOAL #2   Title Patient will report at least 50% improvement in overall symptoms and/or function to  demonstrate improved functional mobility    Baseline 90% better    Time 3    Period Weeks    Status Achieved    Target Date 07/19/20      PT SHORT TERM GOAL #3   Title Patient will be independent in self management strategies to improve quality of life and functional outcomes.    Baseline doing exercise daily    Time 3    Period Weeks    Status Achieved    Target Date 07/19/20               PT Long Term Goals - 07/23/20 0753       PT LONG TERM GOAL #1   Title Patient will be able to ambulate at least 300 feet without an assistive device in 2 minutes to demonstrate improved walking mobility    Time 6    Period Weeks    Status Achieved      PT LONG TERM GOAL #2   Title Patient will improve on FOTO score to meet predicted outcomes to demonstrate improved functional mobility.    Time 6    Period Weeks    Status Achieved      PT LONG TERM GOAL #3   Title Patient will report at least 75% improvement in overall symptoms and/or function to demonstrate improved functional mobility    Baseline 90% better    Time 6    Period Weeks    Status Achieved      PT LONG TERM GOAL #4   Title Patient will be able to ascend and descend 4 stairs with use of railing as needed with reciprocal gait pattern to demonstrate improved stair navigation    Time 6    Period Weeks    Status Achieved                   Plan - 07/23/20 0801     Clinical Impression Statement Patient has met 2/3 STG and 4/4 long term goals. Overall reduced tightness noted after strengthening  exercises. Patient doing well and would benefit from standing strengthening exercises at this time to improve functional endurance, neural input and eccentric strengthening to reduce tightness in quad. No pain reported by end of session. Will continue with terminal knee extension in functional activities and standing strengthening.    Personal Factors and Comorbidities Fitness;Comorbidity 2;Comorbidity 1    Comorbidities  hx right knee scope and lacking knee extension for the last couple years    Examination-Activity Limitations Bathing;Sit;Sleep;Bed Mobility;Squat;Bend;Locomotion Level;Dressing;Transfers;Toileting;Stand;Carry;Stairs    Examination-Participation Restrictions Meal Prep;Occupation;Driving;Community Activity;Cleaning;Shop    Stability/Clinical Decision Making Stable/Uncomplicated    Rehab Potential Good    PT Frequency 3x / week    PT Duration 6 weeks    PT Treatment/Interventions ADLs/Self Care Home Management;Aquatic Therapy;Cryotherapy;Electrical Stimulation;Traction;Moist Heat;Balance training;Therapeutic exercise;Therapeutic activities;Manual techniques;Stair training;Gait training;DME Instruction;Neuromuscular re-education;Patient/family education;Dry needling;Passive range of motion;Scar mobilization;Joint Manipulations    PT Next Visit Plan current ROM goal 5-110 per history of lack of TKE for years - focus on weightbearing strengthening to improve endurance, neural input to leg and LE strengthening    PT Home Exercise Plan heel slides, quad sets, continueous ROM, elevation, prone knee hang; 6/3: heelraise, standing knee flexion, standing terminal extension and SAQ    Consulted and Agree with Plan of Care Patient             Patient will benefit from skilled therapeutic intervention in order to improve the following deficits and impairments:  Pain, Decreased mobility, Difficulty walking, Decreased range of motion, Decreased strength, Decreased activity tolerance, Decreased endurance, Decreased skin integrity, Decreased scar mobility, Increased edema, Decreased knowledge of use of DME  Visit Diagnosis: Difficulty in walking, not elsewhere classified  Muscle weakness (generalized)  Chronic pain of right knee  Decreased range of motion (ROM) of right knee     Problem List There are no problems to display for this patient.  8:30 AM, 07/23/20 Jerene Pitch, DPT Physical Therapy  with [facility]  [phone] office   [facility] [address] [place], [place], [address] Phone: [phone]   Fax:  [phone]  Name: Carnella Fryman MRN: [id number] Date of Birth: 01/07/1966

## 2020-07-25 ENCOUNTER — Encounter (HOSPITAL_COMMUNITY): Payer: Managed Care, Other (non HMO) | Admitting: Physical Therapy

## 2020-07-27 ENCOUNTER — Encounter (HOSPITAL_COMMUNITY): Payer: Self-pay | Admitting: Physical Therapy

## 2020-07-27 ENCOUNTER — Ambulatory Visit (HOSPITAL_COMMUNITY): Payer: Managed Care, Other (non HMO) | Admitting: Physical Therapy

## 2020-07-27 ENCOUNTER — Other Ambulatory Visit: Payer: Self-pay

## 2020-07-27 DIAGNOSIS — R262 Difficulty in walking, not elsewhere classified: Secondary | ICD-10-CM | POA: Diagnosis not present

## 2020-07-27 DIAGNOSIS — M25661 Stiffness of right knee, not elsewhere classified: Secondary | ICD-10-CM

## 2020-07-27 DIAGNOSIS — G8929 Other chronic pain: Secondary | ICD-10-CM

## 2020-07-27 DIAGNOSIS — M6281 Muscle weakness (generalized): Secondary | ICD-10-CM

## 2020-07-27 NOTE — Therapy (Signed)
[facility] [address] [place], [place], [address] Phone: [phone]   Fax:  [phone]  Physical Therapy Treatment  Patient Details  Name: [name] MRN: [id number] Date of Birth: 08-04-1965 Referring Provider (PT): Carroll Sage MD   Encounter Date: 07/27/2020   PT End of Session - 07/27/20 0830     Visit Number 9    Number of Visits 18    Date for PT Re-Evaluation 08/09/20    Authorization Type cigna managed no auth, 20 VL    Authorization - Visit Number 8    Authorization - Number of Visits 20    Progress Note Due on Visit 18    PT Start Time [phone]    PT Stop Time 0915    PT Time Calculation (min) 38 min    Activity Tolerance Patient tolerated treatment well    Behavior During Therapy [facility] for tasks assessed/performed             Past Medical History:  Diagnosis Date   Autoimmune hepatitis (HCC)     Past Surgical History:  Procedure Laterality Date   ABDOMINAL HYSTERECTOMY     CHOLECYSTECTOMY     KNEE SURGERY Bilateral    nerve root compression      There were no vitals filed for this visit.       [facility] PT Assessment - 07/27/20 0001       Assessment   Medical Diagnosis R TKA    Referring Provider (PT) Carroll Sage MD    Next MD Visit 08/07/20                           [facility] Adult PT Treatment/Exercise - 07/27/20 0001       Knee/Hip Exercises: Standing   Other Standing Knee Exercises step up down on 6" step x15 R slow step down--> progressed to 8" step 2x14 R    Other Standing Knee Exercises TKE with blue band retro and foward x20 each on right      Knee/Hip Exercises: Seated   Other Seated Knee/Hip Exercises patella mobilizations inferiorly right x15 5" holds; rolling pin to right leg -8 minutes                      PT Short Term Goals - 07/23/20 0753       PT SHORT TERM GOAL #1   Title Patient will demonstrate at least 5-110 degrees of right knee  ROM    Baseline 10-110    Time 3    Period Weeks    Status On-going    Target Date 07/19/20      PT SHORT TERM GOAL #2   Title Patient will report at least 50% improvement in overall symptoms and/or function to demonstrate improved functional mobility    Baseline 90% better    Time 3    Period Weeks    Status Achieved    Target Date 07/19/20      PT SHORT TERM GOAL #3   Title Patient will be independent in self management strategies to improve quality of life and functional outcomes.    Baseline doing exercise daily    Time 3    Period Weeks    Status Achieved    Target Date 07/19/20               PT Long Term Goals - 07/23/20 3419  PT LONG TERM GOAL #1   Title Patient will be able to ambulate at least 300 feet without an assistive device in 2 minutes to demonstrate improved walking mobility    Time 6    Period Weeks    Status Achieved      PT LONG TERM GOAL #2   Title Patient will improve on FOTO score to meet predicted outcomes to demonstrate improved functional mobility.    Time 6    Period Weeks    Status Achieved      PT LONG TERM GOAL #3   Title Patient will report at least 75% improvement in overall symptoms and/or function to demonstrate improved functional mobility    Baseline 90% better    Time 6    Period Weeks    Status Achieved      PT LONG TERM GOAL #4   Title Patient will be able to ascend and descend 4 stairs with use of railing as needed with reciprocal gait pattern to demonstrate improved stair navigation    Time 6    Period Weeks    Status Achieved                   Plan - 07/27/20 0831     Clinical Impression Statement Tolerated manual work well with posterior tib muscle belly very sensitive to palpation. Joint mobilizations tolerated well with improved mobility noted afterwards. No increase in pain noted end of session but increased soreness where manual was performed. Will continue with current POC as tolerated     Personal Factors and Comorbidities Fitness;Comorbidity 2;Comorbidity 1    Comorbidities hx right knee scope and lacking knee extension for the last couple years    Examination-Activity Limitations Bathing;Sit;Sleep;Bed Mobility;Squat;Bend;Locomotion Level;Dressing;Transfers;Toileting;Stand;Carry;Stairs    Examination-Participation Restrictions Meal Prep;Occupation;Driving;Community Activity;Cleaning;Shop    Stability/Clinical Decision Making Stable/Uncomplicated    Rehab Potential Good    PT Frequency 3x / week    PT Duration 6 weeks    PT Treatment/Interventions ADLs/Self Care Home Management;Aquatic Therapy;Cryotherapy;Electrical Stimulation;Traction;Moist Heat;Balance training;Therapeutic exercise;Therapeutic activities;Manual techniques;Stair training;Gait training;DME Instruction;Neuromuscular re-education;Patient/family education;Dry needling;Passive range of motion;Scar mobilization;Joint Manipulations    PT Next Visit Plan troubleshoot/practice kneeling, floor transfers, current ROM goal 5-110 per history of lack of TKE for years - focus on weightbearing strengthening to improve endurance, neural input to leg and LE strengthening    PT Home Exercise Plan heel slides, quad sets, continueous ROM, elevation, prone knee hang; 6/3: heelraise, standing knee flexion, standing terminal extension and SAQ    Consulted and Agree with Plan of Care Patient             Patient will benefit from skilled therapeutic intervention in order to improve the following deficits and impairments:  Pain, Decreased mobility, Difficulty walking, Decreased range of motion, Decreased strength, Decreased activity tolerance, Decreased endurance, Decreased skin integrity, Decreased scar mobility, Increased edema, Decreased knowledge of use of DME  Visit Diagnosis: Difficulty in walking, not elsewhere classified  Chronic pain of right knee  Muscle weakness (generalized)  Decreased range of motion (ROM) of right  knee     Problem List There are no problems to display for this patient.  9:18 AM, 07/27/20 Tereasa Coop, DPT Physical Therapy with [facility]  [phone] office   [facility] [address] [place], [place], [address] Phone: [phone]   Fax:  [phone]  Name: Shital Crayton MRN: [id number] Date of Birth: Jun 18, 1965

## 2020-07-30 ENCOUNTER — Other Ambulatory Visit: Payer: Self-pay

## 2020-07-30 ENCOUNTER — Ambulatory Visit (HOSPITAL_COMMUNITY): Payer: Managed Care, Other (non HMO) | Admitting: Physical Therapy

## 2020-07-30 ENCOUNTER — Encounter (HOSPITAL_COMMUNITY): Payer: Self-pay | Admitting: Physical Therapy

## 2020-07-30 DIAGNOSIS — M25661 Stiffness of right knee, not elsewhere classified: Secondary | ICD-10-CM

## 2020-07-30 DIAGNOSIS — G8929 Other chronic pain: Secondary | ICD-10-CM

## 2020-07-30 DIAGNOSIS — M25561 Pain in right knee: Secondary | ICD-10-CM

## 2020-07-30 DIAGNOSIS — M6281 Muscle weakness (generalized): Secondary | ICD-10-CM

## 2020-07-30 DIAGNOSIS — R262 Difficulty in walking, not elsewhere classified: Secondary | ICD-10-CM | POA: Diagnosis not present

## 2020-07-30 NOTE — Therapy (Signed)
[facility] [address] [place], [place], [address] Phone: [phone]   Fax:  [phone]  Physical Therapy Treatment  Patient Details  Name: [name] MRN: [id number] Date of Birth: [birth year] Referring Provider (PT): Carroll Sage MD   Encounter Date: 07/30/2020   PT End of Session - 07/30/20 0834     Visit Number 10    Number of Visits 18    Date for PT Re-Evaluation 08/09/20    Authorization Type cigna managed no auth, 20 VL    Authorization - Visit Number 9    Authorization - Number of Visits 20    Progress Note Due on Visit 18    PT Start Time [phone]    PT Stop Time 0912    PT Time Calculation (min) 38 min    Activity Tolerance Patient tolerated treatment well    Behavior During Therapy [facility] for tasks assessed/performed             Past Medical History:  Diagnosis Date   Autoimmune hepatitis (HCC)     Past Surgical History:  Procedure Laterality Date   ABDOMINAL HYSTERECTOMY     CHOLECYSTECTOMY     KNEE SURGERY Bilateral    nerve root compression      There were no vitals filed for this visit.   Subjective Assessment - 07/30/20 0834     Subjective Patient states not much pain, some continued stiffness. Her home exercises are going well.    Pertinent History of right knee scope in 2017, lacking knee extension for years on right    Patient Stated Goals to be able to walk without walker    Currently in Pain? No/denies                               [facility] Adult PT Treatment/Exercise - 07/30/20 0001       Knee/Hip Exercises: Stretches   Lobbyist 5 reps;20 seconds    Quad Stretch Limitations prone with rope      Knee/Hip Exercises: Aerobic   Stationary Bike 5 minutes rocking seat 14      Knee/Hip Exercises: Standing   Other Standing Knee Exercises lunge transfers to/from blue foam on floor 1x 5, 2x5 6 inch box with blue foam      Knee/Hip Exercises: Seated   Sit to Sand  10 reps;3 sets   1st set on floor, 2nd set LLE on blue foam     Knee/Hip Exercises: Supine   Knee Extension AROM    Knee Extension Limitations 10   lacking   Knee Flexion AROM    Knee Flexion Limitations 109   improves to 114 following prone quad stretch                   PT Education - 07/30/20 0834     Education Details HEP    Person(s) Educated Patient    Methods Explanation;Demonstration    Comprehension Verbalized understanding;Returned demonstration              PT Short Term Goals - 07/23/20 0753       PT SHORT TERM GOAL #1   Title Patient will demonstrate at least 5-110 degrees of right knee ROM    Baseline 10-110    Time 3    Period Weeks    Status On-going    Target Date 07/19/20      PT SHORT  TERM GOAL #2   Title Patient will report at least 50% improvement in overall symptoms and/or function to demonstrate improved functional mobility    Baseline 90% better    Time 3    Period Weeks    Status Achieved    Target Date 07/19/20      PT SHORT TERM GOAL #3   Title Patient will be independent in self management strategies to improve quality of life and functional outcomes.    Baseline doing exercise daily    Time 3    Period Weeks    Status Achieved    Target Date 07/19/20               PT Long Term Goals - 07/23/20 0753       PT LONG TERM GOAL #1   Title Patient will be able to ambulate at least 300 feet without an assistive device in 2 minutes to demonstrate improved walking mobility    Time 6    Period Weeks    Status Achieved      PT LONG TERM GOAL #2   Title Patient will improve on FOTO score to meet predicted outcomes to demonstrate improved functional mobility.    Time 6    Period Weeks    Status Achieved      PT LONG TERM GOAL #3   Title Patient will report at least 75% improvement in overall symptoms and/or function to demonstrate improved functional mobility    Baseline 90% better    Time 6    Period Weeks     Status Achieved      PT LONG TERM GOAL #4   Title Patient will be able to ascend and descend 4 stairs with use of railing as needed with reciprocal gait pattern to demonstrate improved stair navigation    Time 6    Period Weeks    Status Achieved                   Plan - 07/30/20 0834     Clinical Impression Statement Began session with bike with rocking for ROM and dynamic warm up. Patient unable to complete full oscillations due to impaired ROM and stiffness. Patient showing ROM from lacking 10 to 109 at beginning of session. ROM improves to 114 following prone quad stretch. Patient given cueing for proper foot placement with STS exercise and is able to complete with blue foam under LLE for RLE bias. Patient with straining and heavy UE support with lunge transitions from blue foam on floor, demonstrates improved mechanics and reduced UE support with 6 inch box with foam on top. Patient will continue to benefit from skilled physical therapy in order to reduce impairment and improve function.    Personal Factors and Comorbidities Fitness;Comorbidity 2;Comorbidity 1    Comorbidities hx right knee scope and lacking knee extension for the last couple years    Examination-Activity Limitations Bathing;Sit;Sleep;Bed Mobility;Squat;Bend;Locomotion Level;Dressing;Transfers;Toileting;Stand;Carry;Stairs    Examination-Participation Restrictions Meal Prep;Occupation;Driving;Community Activity;Cleaning;Shop    Stability/Clinical Decision Making Stable/Uncomplicated    Rehab Potential Good    PT Frequency 3x / week    PT Duration 6 weeks    PT Treatment/Interventions ADLs/Self Care Home Management;Aquatic Therapy;Cryotherapy;Electrical Stimulation;Traction;Moist Heat;Balance training;Therapeutic exercise;Therapeutic activities;Manual techniques;Stair training;Gait training;DME Instruction;Neuromuscular re-education;Patient/family education;Dry needling;Passive range of motion;Scar mobilization;Joint  Manipulations    PT Next Visit Plan troubleshoot/practice kneeling, floor transfers, current ROM goal 5-110 per history of lack of TKE for years - focus on weightbearing strengthening to improve endurance,  neural input to leg and LE strengthening    PT Home Exercise Plan heel slides, quad sets, continueous ROM, elevation, prone knee hang; 6/3: heelraise, standing knee flexion, standing terminal extension and SAQ    Consulted and Agree with Plan of Care Patient             Patient will benefit from skilled therapeutic intervention in order to improve the following deficits and impairments:  Pain, Decreased mobility, Difficulty walking, Decreased range of motion, Decreased strength, Decreased activity tolerance, Decreased endurance, Decreased skin integrity, Decreased scar mobility, Increased edema, Decreased knowledge of use of DME  Visit Diagnosis: Difficulty in walking, not elsewhere classified  Chronic pain of right knee  Muscle weakness (generalized)  Decreased range of motion (ROM) of right knee     Problem List There are no problems to display for this patient.  9:12 AM, 07/30/20 Wyman Songster PT, DPT Physical Therapist at [facility]    [facility] [address] [place], [place], [address] Phone: [phone]   Fax:  [phone]  Name: [name] MRN: [id number] Date of Birth: 04/04/1965

## 2020-08-01 ENCOUNTER — Ambulatory Visit (HOSPITAL_COMMUNITY): Payer: Managed Care, Other (non HMO) | Admitting: Physical Therapy

## 2020-08-01 ENCOUNTER — Other Ambulatory Visit: Payer: Self-pay

## 2020-08-01 DIAGNOSIS — M25661 Stiffness of right knee, not elsewhere classified: Secondary | ICD-10-CM

## 2020-08-01 DIAGNOSIS — M6281 Muscle weakness (generalized): Secondary | ICD-10-CM

## 2020-08-01 DIAGNOSIS — R262 Difficulty in walking, not elsewhere classified: Secondary | ICD-10-CM | POA: Diagnosis not present

## 2020-08-01 DIAGNOSIS — G8929 Other chronic pain: Secondary | ICD-10-CM

## 2020-08-01 NOTE — Therapy (Signed)
[facility] [address] [place], [place], [address] Phone: [phone]   Fax:  [phone]  Physical Therapy Treatment  Patient Details  Name: [name] MRN: [id number] Date of Birth: 02/10/1966 Referring Provider (PT): Carroll Sage MD   Encounter Date: 08/01/2020   PT End of Session - 08/01/20 1438     Visit Number 11    Number of Visits 18    Date for PT Re-Evaluation 08/09/20    Authorization Type cigna managed no auth, 20 VL    Authorization - Visit Number 11    Authorization - Number of Visits 20    Progress Note Due on Visit 18    PT Start Time 1405    PT Stop Time 1445    PT Time Calculation (min) 40 min    Activity Tolerance Patient tolerated treatment well    Behavior During Therapy [facility] for tasks assessed/performed             Past Medical History:  Diagnosis Date   Autoimmune hepatitis (HCC)     Past Surgical History:  Procedure Laterality Date   ABDOMINAL HYSTERECTOMY     CHOLECYSTECTOMY     KNEE SURGERY Bilateral    nerve root compression      There were no vitals filed for this visit.   Subjective Assessment - 08/01/20 1408     Subjective PT states that she really does not have any knee pain at this time; just stiffness.    Pertinent History of right knee scope in 2017, lacking knee extension for years on right    Patient Stated Goals to be able to walk without walker    Currently in Pain? No/denies                               [facility] Adult PT Treatment/Exercise - 08/01/20 0001       Knee/Hip Exercises: Stretches   Active Hamstring Stretch Right;3 reps;30 seconds    Passive Hamstring Stretch Right;3 reps;30 seconds    Quad Stretch 5 reps;20 seconds    Quad Stretch Limitations prone with rope    Knee: Self-Stretch to increase Flexion Right;3 reps;30 seconds    Gastroc Stretch 3 reps;30 seconds    Gastroc Stretch Limitations slant board      Knee/Hip Exercises:  Standing   Heel Raises Both;15 reps    Knee Flexion Strengthening;Right;10 reps    Terminal Knee Extension Strengthening;15 reps    Forward Step Up Right;Hand Hold: 2;Step Height: 6";10 reps    Rocker Board 2 minutes      Knee/Hip Exercises: Supine   Knee Extension AROM    Knee Extension Limitations 8    Knee Flexion AROM    Knee Flexion Limitations 112      Knee/Hip Exercises: Prone   Hamstring Curl 10 reps    Straight Leg Raises Both;5 reps    Other Prone Exercises terminal extensions 10                      PT Short Term Goals - 07/23/20 0753       PT SHORT TERM GOAL #1   Title Patient will demonstrate at least 5-110 degrees of right knee ROM    Baseline 10-110    Time 3    Period Weeks    Status On-going    Target Date 07/19/20      PT  SHORT TERM GOAL #2   Title Patient will report at least 50% improvement in overall symptoms and/or function to demonstrate improved functional mobility    Baseline 90% better    Time 3    Period Weeks    Status Achieved    Target Date 07/19/20      PT SHORT TERM GOAL #3   Title Patient will be independent in self management strategies to improve quality of life and functional outcomes.    Baseline doing exercise daily    Time 3    Period Weeks    Status Achieved    Target Date 07/19/20               PT Long Term Goals - 07/23/20 0753       PT LONG TERM GOAL #1   Title Patient will be able to ambulate at least 300 feet without an assistive device in 2 minutes to demonstrate improved walking mobility    Time 6    Period Weeks    Status Achieved      PT LONG TERM GOAL #2   Title Patient will improve on FOTO score to meet predicted outcomes to demonstrate improved functional mobility.    Time 6    Period Weeks    Status Achieved      PT LONG TERM GOAL #3   Title Patient will report at least 75% improvement in overall symptoms and/or function to demonstrate improved functional mobility    Baseline 90%  better    Time 6    Period Weeks    Status Achieved      PT LONG TERM GOAL #4   Title Patient will be able to ascend and descend 4 stairs with use of railing as needed with reciprocal gait pattern to demonstrate improved stair navigation    Time 6    Period Weeks    Status Achieved                   Plan - 08/01/20 1439     Clinical Impression Statement PT improved with AROM, Added strengthening as pt ROM continues to improve.   PT needs verbal cuing for prone exercise to stop substitutional patterns.  Pt has noted weak gluteal mm    Personal Factors and Comorbidities Fitness;Comorbidity 2;Comorbidity 1    Comorbidities hx right knee scope and lacking knee extension for the last couple years    Examination-Activity Limitations Bathing;Sit;Sleep;Bed Mobility;Squat;Bend;Locomotion Level;Dressing;Transfers;Toileting;Stand;Carry;Stairs    Examination-Participation Restrictions Meal Prep;Occupation;Driving;Community Activity;Cleaning;Shop    Stability/Clinical Decision Making Stable/Uncomplicated    Rehab Potential Good    PT Frequency 3x / week    PT Duration 6 weeks    PT Treatment/Interventions ADLs/Self Care Home Management;Aquatic Therapy;Cryotherapy;Electrical Stimulation;Traction;Moist Heat;Balance training;Therapeutic exercise;Therapeutic activities;Manual techniques;Stair training;Gait training;DME Instruction;Neuromuscular re-education;Patient/family education;Dry needling;Passive range of motion;Scar mobilization;Joint Manipulations    PT Next Visit Plan Add balance activity continue to work on gaining ROM and weightbearing strengthening to improve endurance, neural input to leg and LE strengthening    PT Home Exercise Plan heel slides, quad sets, continueous ROM, elevation, prone knee hang; 6/3: heelraise, standing knee flexion, standing terminal extension and SAQ    Consulted and Agree with Plan of Care Patient             Patient will benefit from skilled  therapeutic intervention in order to improve the following deficits and impairments:  Pain, Decreased mobility, Difficulty walking, Decreased range of motion, Decreased strength, Decreased activity tolerance, Decreased endurance,  Decreased skin integrity, Decreased scar mobility, Increased edema, Decreased knowledge of use of DME  Visit Diagnosis: Difficulty in walking, not elsewhere classified  Chronic pain of right knee  Muscle weakness (generalized)  Decreased range of motion (ROM) of right knee     Problem List There are no problems to display for this patient.  Virgina Organ, PT CLT [phone]  08/01/2020, 2:50 PM  [facility] [address] [place], [place], [address] Phone: [phone]   Fax:  [phone]  Name: [name] MRN: [id number] Date of Birth: 1966/01/30

## 2020-08-03 ENCOUNTER — Encounter (HOSPITAL_COMMUNITY): Payer: Managed Care, Other (non HMO)

## 2020-08-06 ENCOUNTER — Ambulatory Visit (HOSPITAL_COMMUNITY): Payer: Managed Care, Other (non HMO) | Admitting: Physical Therapy

## 2020-08-06 ENCOUNTER — Other Ambulatory Visit: Payer: Self-pay

## 2020-08-06 DIAGNOSIS — R262 Difficulty in walking, not elsewhere classified: Secondary | ICD-10-CM

## 2020-08-06 DIAGNOSIS — M25661 Stiffness of right knee, not elsewhere classified: Secondary | ICD-10-CM

## 2020-08-06 DIAGNOSIS — M6281 Muscle weakness (generalized): Secondary | ICD-10-CM

## 2020-08-06 DIAGNOSIS — G8929 Other chronic pain: Secondary | ICD-10-CM

## 2020-08-06 NOTE — Therapy (Signed)
[facility] [address] [place], [place], [address] Phone: [phone]   Fax:  [phone]  Physical Therapy Treatment  Patient Details  Name: [name] MRN: [id number] Date of Birth: 1965/08/14 Referring Provider (PT): Carroll Sage MD   Encounter Date: 08/06/2020   PT End of Session - 08/06/20 1429     Visit Number 12    Number of Visits 18    Date for PT Re-Evaluation 08/09/20    Authorization Type cigna managed no auth, 20 VL    Authorization - Visit Number 12    Authorization - Number of Visits 20    Progress Note Due on Visit 18    PT Start Time 0840    PT Stop Time 0918    PT Time Calculation (min) 38 min    Activity Tolerance Patient tolerated treatment well    Behavior During Therapy [facility] for tasks assessed/performed             Past Medical History:  Diagnosis Date   Autoimmune hepatitis (HCC)     Past Surgical History:  Procedure Laterality Date   ABDOMINAL HYSTERECTOMY     CHOLECYSTECTOMY     KNEE SURGERY Bilateral    nerve root compression      There were no vitals filed for this visit.   Subjective Assessment - 08/06/20 0844     Subjective pt states her stamina is most lacking, no real issues with her knee besides stiffness.    Currently in Pain? No/denies                               [facility] Adult PT Treatment/Exercise - 08/06/20 0001       Knee/Hip Exercises: Stretches   Active Hamstring Stretch Right;3 reps;30 seconds    Active Hamstring Stretch Limitations standing with 12" step    Knee: Self-Stretch to increase Flexion Right;3 reps;30 seconds    Knee: Self-Stretch Limitations standing with 12" step    Gastroc Stretch 3 reps;30 seconds    Gastroc Stretch Limitations slant board      Knee/Hip Exercises: Standing   Heel Raises Both;15 reps    Knee Flexion Strengthening;Right;10 reps    SLS with Vectors 5X5" each with 1 HHA      Knee/Hip Exercises: Supine    Knee Extension AROM    Knee Extension Limitations 7    Knee Flexion AROM    Knee Flexion Limitations 118      Knee/Hip Exercises: Prone   Prone Knee Hang 2 minutes    Prone Knee Hang Limitations manual to posterior knee while completing knee hang      Manual Therapy   Manual Therapy Soft tissue mobilization    Manual therapy comments Manual complete separate than rest of tx    Soft tissue mobilization to posterior knee and tight mm                      PT Short Term Goals - 07/23/20 0753       PT SHORT TERM GOAL #1   Title Patient will demonstrate at least 5-110 degrees of right knee ROM    Baseline 10-110    Time 3    Period Weeks    Status On-going    Target Date 07/19/20      PT SHORT TERM GOAL #2   Title Patient will report at least 50% improvement in  overall symptoms and/or function to demonstrate improved functional mobility    Baseline 90% better    Time 3    Period Weeks    Status Achieved    Target Date 07/19/20      PT SHORT TERM GOAL #3   Title Patient will be independent in self management strategies to improve quality of life and functional outcomes.    Baseline doing exercise daily    Time 3    Period Weeks    Status Achieved    Target Date 07/19/20               PT Long Term Goals - 07/23/20 0753       PT LONG TERM GOAL #1   Title Patient will be able to ambulate at least 300 feet without an assistive device in 2 minutes to demonstrate improved walking mobility    Time 6    Period Weeks    Status Achieved      PT LONG TERM GOAL #2   Title Patient will improve on FOTO score to meet predicted outcomes to demonstrate improved functional mobility.    Time 6    Period Weeks    Status Achieved      PT LONG TERM GOAL #3   Title Patient will report at least 75% improvement in overall symptoms and/or function to demonstrate improved functional mobility    Baseline 90% better    Time 6    Period Weeks    Status Achieved      PT  LONG TERM GOAL #4   Title Patient will be able to ascend and descend 4 stairs with use of railing as needed with reciprocal gait pattern to demonstrate improved stair navigation    Time 6    Period Weeks    Status Achieved                   Plan - 08/06/20 1432     Clinical Impression Statement Continued with focus on improving Rt knee ROM and strength.  Began stability challenge with added vectors and noted difficulty completing/early fatigue.  Pt required rest breaks during this task as well. Added standing hamstring stretch with gentle push onto knee to encourage increase extension while using 12" step.  Prone knee hang also added with therapist completing manual to posterior aspect to reduce mm tightness and adhesions.   Able to relax tight hamstring mm in prone and continued with scar release to anterior knee.  Scar release to proximal aspect of incision with audible pop.  ROM this session 7-118.  Pt became emotional due to satisfaction with therapy and progress made.  Returns to MD tomorrow and plans to discuss setting up for Lt TKA.  Pt with questions regarding driving and informed this was okay for her return to since she is no longer taking meds and her ROM/strength is [facility]    Personal Factors and Comorbidities Fitness;Comorbidity 2;Comorbidity 1    Comorbidities hx right knee scope and lacking knee extension for the last couple years    Examination-Activity Limitations Bathing;Sit;Sleep;Bed Mobility;Squat;Bend;Locomotion Level;Dressing;Transfers;Toileting;Stand;Carry;Stairs    Examination-Participation Restrictions Meal Prep;Occupation;Driving;Community Activity;Cleaning;Shop    Stability/Clinical Decision Making Stable/Uncomplicated    Rehab Potential Good    PT Frequency 3x / week    PT Duration 6 weeks    PT Treatment/Interventions ADLs/Self Care Home Management;Aquatic Therapy;Cryotherapy;Electrical Stimulation;Traction;Moist Heat;Balance training;Therapeutic  exercise;Therapeutic activities;Manual techniques;Stair training;Gait training;DME Instruction;Neuromuscular re-education;Patient/family education;Dry needling;Passive range of motion;Scar mobilization;Joint Manipulations    PT  Next Visit Plan Add balance activity continue to work on gaining ROM and weightbearing strengthening to improve endurance, neural input to leg and LE strengthening    PT Home Exercise Plan heel slides, quad sets, continueous ROM, elevation, prone knee hang; 6/3: heelraise, standing knee flexion, standing terminal extension and SAQ    Consulted and Agree with Plan of Care Patient             Patient will benefit from skilled therapeutic intervention in order to improve the following deficits and impairments:  Pain, Decreased mobility, Difficulty walking, Decreased range of motion, Decreased strength, Decreased activity tolerance, Decreased endurance, Decreased skin integrity, Decreased scar mobility, Increased edema, Decreased knowledge of use of DME  Visit Diagnosis: Difficulty in walking, not elsewhere classified  Chronic pain of right knee  Muscle weakness (generalized)  Decreased range of motion (ROM) of right knee     Problem List There are no problems to display for this patient.  Lurena Nida, PTA/CLT [phone]  Lurena Nida 08/06/2020, 2:34 PM  [facility] [address] [place], [place], [address] Phone: [phone]   Fax:  [phone]  Name: Almira Phetteplace MRN: [id number] Date of Birth: May 10, 1965

## 2020-08-08 ENCOUNTER — Encounter (HOSPITAL_COMMUNITY): Payer: Managed Care, Other (non HMO) | Admitting: Physical Therapy

## 2020-08-10 ENCOUNTER — Telehealth (HOSPITAL_COMMUNITY): Payer: Self-pay

## 2020-08-10 ENCOUNTER — Ambulatory Visit (HOSPITAL_COMMUNITY): Payer: Managed Care, Other (non HMO)

## 2020-08-10 NOTE — Telephone Encounter (Signed)
No show, called and left message concerning missed apt today.  Reminded next apt date and time with contact number included if needs to cancel or reschedule future apts.   Becky Sax, LPTA/CLT; [facility] [phone]

## 2020-08-14 ENCOUNTER — Other Ambulatory Visit: Payer: Self-pay

## 2020-08-14 ENCOUNTER — Ambulatory Visit (HOSPITAL_COMMUNITY): Payer: Managed Care, Other (non HMO) | Attending: Physician Assistant | Admitting: Physical Therapy

## 2020-08-14 ENCOUNTER — Encounter (HOSPITAL_COMMUNITY): Payer: Self-pay

## 2020-08-14 DIAGNOSIS — G8929 Other chronic pain: Secondary | ICD-10-CM

## 2020-08-14 DIAGNOSIS — R262 Difficulty in walking, not elsewhere classified: Secondary | ICD-10-CM | POA: Diagnosis present

## 2020-08-14 DIAGNOSIS — M25661 Stiffness of right knee, not elsewhere classified: Secondary | ICD-10-CM | POA: Diagnosis present

## 2020-08-14 DIAGNOSIS — M25561 Pain in right knee: Secondary | ICD-10-CM | POA: Diagnosis present

## 2020-08-14 DIAGNOSIS — M6281 Muscle weakness (generalized): Secondary | ICD-10-CM | POA: Diagnosis present

## 2020-08-14 NOTE — Therapy (Signed)
[facility] [address] [place], [place], [address] Phone: [phone]   Fax:  [phone]  Physical Therapy Treatment and RECERT and Discharge    Patient Details  Name: [name] MRN: [id number] Date of Birth: 1965-11-20 Referring Provider (PT): Ardyth Harps MD   PHYSICAL THERAPY DISCHARGE SUMMARY  Visits from Start of Care: 13  Current functional level related to goals / functional outcomes: see below   Remaining deficits: Knee ROM    Education / Equipment: See below   Patient agrees to discharge. Patient goals were partially met. Patient is being discharged due to being pleased with the current functional level.   Encounter Date: 08/14/2020   PT End of Session - 08/14/20 0841     Visit Number 13    Number of Visits 18    Date for PT Re-Evaluation 08/21/20    Authorization Type cigna managed no auth, 20 VL    Authorization - Visit Number 13    Authorization - Number of Visits 20    Progress Note Due on Visit 18    PT Start Time 0832    PT Stop Time 0906    PT Time Calculation (min) 34 min    Activity Tolerance Patient tolerated treatment well    Behavior During Therapy WFL for tasks assessed/performed             Past Medical History:  Diagnosis Date   Autoimmune hepatitis ([place])     Past Surgical History:  Procedure Laterality Date   ABDOMINAL HYSTERECTOMY     CHOLECYSTECTOMY     KNEE SURGERY Bilateral    nerve root compression      There were no vitals filed for this visit.   Subjective Assessment - 08/14/20 0838     Subjective Pt stated MD was happy with progress.  Pt stated her personal goals have been stiff.  No reports of pain currently, does have stiffness this morning. States overall she feels about 90% better. States she still is swollen inside but there is nothing she wants to do that she isn't able to do. State she is working on her balance. States that she still can't straighten it  all the way. Reports that she is having her left knee replaced in November.    Pertinent History of right knee scope in 2017, lacking knee extension for years on right    Patient Stated Goals to be able to walk without walker    Currently in Pain? No/denies    Pain Score --   not pain just stiff/tight   Pain Location Knee    Pain Descriptors / Indicators Tightness    Pain Type Surgical pain                [facility] PT Assessment - 08/14/20 0001       Assessment   Medical Diagnosis R TKA    Referring Provider (PT) Ardyth Harps MD                           [facility] Adult PT Treatment/Exercise - 08/14/20 0001       Knee/Hip Exercises: Standing   Step Down Hand Hold: 1;20 reps;Both;Step Height: 8"   reciprocal     Knee/Hip Exercises: Supine   Bridges --   at different knee flexion, x35   Knee Extension AROM;Right    Knee Extension Limitations 9    Knee Flexion AROM;Right  Knee Flexion Limitations 118    Other Supine Knee/Hip Exercises heel slides x20 5" holds R                    PT Education - 08/14/20 0850     Education Details on current presentation, on HEP, progressed exercises, answered questions, on upcoming knee surgery and PT visits left, walking program and using treadmill and incline and posture    Person(s) Educated Patient    Methods Explanation    Comprehension Verbalized understanding              PT Short Term Goals - 07/23/20 0753       PT SHORT TERM GOAL #1   Title Patient will demonstrate at least 5-110 degrees of right knee ROM    Baseline 10-110    Time 3    Period Weeks    Status On-going    Target Date 07/19/20      PT SHORT TERM GOAL #2   Title Patient will report at least 50% improvement in overall symptoms and/or function to demonstrate improved functional mobility    Baseline 90% better    Time 3    Period Weeks    Status Achieved    Target Date 07/19/20      PT SHORT TERM GOAL #3   Title Patient  will be independent in self management strategies to improve quality of life and functional outcomes.    Baseline doing exercise daily    Time 3    Period Weeks    Status Achieved    Target Date 07/19/20               PT Long Term Goals - 07/23/20 0753       PT LONG TERM GOAL #1   Title Patient will be able to ambulate at least 300 feet without an assistive device in 2 minutes to demonstrate improved walking mobility    Time 6    Period Weeks    Status Achieved      PT LONG TERM GOAL #2   Title Patient will improve on FOTO score to meet predicted outcomes to demonstrate improved functional mobility.    Time 6    Period Weeks    Status Achieved      PT LONG TERM GOAL #3   Title Patient will report at least 75% improvement in overall symptoms and/or function to demonstrate improved functional mobility    Baseline 90% better    Time 6    Period Weeks    Status Achieved      PT LONG TERM GOAL #4   Title Patient will be able to ascend and descend 4 stairs with use of railing as needed with reciprocal gait pattern to demonstrate improved stair navigation    Time 6    Period Weeks    Status Achieved                   Plan - 08/14/20 0847     Clinical Impression Statement Overall patient has done really well and has met all long term goals. ROM still continues to be limited but this is much improved since initial evaluation. Reviewed HEP and progressed exercises as tolerated. Fatigue noted with bridges. Answered all questions and discussed visit limitation and plan for left knee replacement later this year. Encouraged patient to call insurance to inquire about visit limit and to determine if it is hard visit cap. Patient to discharge  from PT at this time secondary to progress made and independence at this time.    Personal Factors and Comorbidities Fitness;Comorbidity 2;Comorbidity 1    Comorbidities hx right knee scope and lacking knee extension for the last couple  years    Examination-Activity Limitations Bathing;Sit;Sleep;Bed Mobility;Squat;Bend;Locomotion Level;Dressing;Transfers;Toileting;Stand;Carry;Stairs    Examination-Participation Restrictions Meal Prep;Occupation;Driving;Community Activity;Cleaning;Shop    Stability/Clinical Decision Making Stable/Uncomplicated    Rehab Potential Good    PT Frequency 3x / week    PT Duration 6 weeks    PT Treatment/Interventions ADLs/Self Care Home Management;Aquatic Therapy;Cryotherapy;Electrical Stimulation;Traction;Moist Heat;Balance training;Therapeutic exercise;Therapeutic activities;Manual techniques;Stair training;Gait training;DME Instruction;Neuromuscular re-education;Patient/family education;Dry needling;Passive range of motion;Scar mobilization;Joint Manipulations    PT Next Visit Plan DC to HEP    PT Home Exercise Plan heel slides, quad sets, continueous ROM, elevation, prone knee hang; 6/3: heelraise, standing knee flexion, standing terminal extension and SAQ, Standign TKE with band, lunge review, step downs, bride    Consulted and Agree with Plan of Care Patient             Patient will benefit from skilled therapeutic intervention in order to improve the following deficits and impairments:  Pain, Decreased mobility, Difficulty walking, Decreased range of motion, Decreased strength, Decreased activity tolerance, Decreased endurance, Decreased skin integrity, Decreased scar mobility, Increased edema, Decreased knowledge of use of DME  Visit Diagnosis: Difficulty in walking, not elsewhere classified - Plan: PT plan of care cert/re-cert  Chronic pain of right knee - Plan: PT plan of care cert/re-cert  Muscle weakness (generalized) - Plan: PT plan of care cert/re-cert  Decreased range of motion (ROM) of right knee - Plan: PT plan of care cert/re-cert     Problem List There are no problems to display for this patient.  9:12 AM, 08/14/20 Jerene Pitch, DPT Physical Therapy with  [facility]  [phone] office   [place] [address] [place], [place], [address] Phone: [phone]   Fax:  [phone]  Name: [name] MRN: [id number] Date of Birth: 03-18-1965

## 2020-08-15 ENCOUNTER — Encounter (HOSPITAL_COMMUNITY): Payer: Managed Care, Other (non HMO)

## 2020-08-17 ENCOUNTER — Ambulatory Visit (HOSPITAL_COMMUNITY): Payer: Managed Care, Other (non HMO)

## 2020-08-20 ENCOUNTER — Encounter (HOSPITAL_COMMUNITY): Payer: Managed Care, Other (non HMO) | Admitting: Physical Therapy

## 2020-08-22 ENCOUNTER — Encounter (HOSPITAL_COMMUNITY): Payer: Managed Care, Other (non HMO) | Admitting: Physical Therapy

## 2020-08-24 ENCOUNTER — Encounter (HOSPITAL_COMMUNITY): Payer: Managed Care, Other (non HMO)

## 2020-08-27 ENCOUNTER — Encounter (HOSPITAL_COMMUNITY): Payer: Managed Care, Other (non HMO) | Admitting: Physical Therapy

## 2020-08-29 ENCOUNTER — Encounter (HOSPITAL_COMMUNITY): Payer: Managed Care, Other (non HMO) | Admitting: Physical Therapy

## 2020-08-31 ENCOUNTER — Encounter (HOSPITAL_COMMUNITY): Payer: Managed Care, Other (non HMO)

## 2020-11-07 ENCOUNTER — Other Ambulatory Visit: Payer: Self-pay | Admitting: Nurse Practitioner

## 2020-11-07 DIAGNOSIS — K760 Fatty (change of) liver, not elsewhere classified: Secondary | ICD-10-CM

## 2020-11-07 DIAGNOSIS — K7400 Hepatic fibrosis, unspecified: Secondary | ICD-10-CM

## 2020-11-07 DIAGNOSIS — K754 Autoimmune hepatitis: Secondary | ICD-10-CM

## 2020-11-23 ENCOUNTER — Other Ambulatory Visit: Payer: Managed Care, Other (non HMO)

## 2020-12-31 ENCOUNTER — Encounter (HOSPITAL_COMMUNITY): Payer: Self-pay | Admitting: Physical Therapy

## 2020-12-31 ENCOUNTER — Ambulatory Visit (HOSPITAL_COMMUNITY): Payer: Managed Care, Other (non HMO) | Attending: Internal Medicine | Admitting: Physical Therapy

## 2020-12-31 ENCOUNTER — Other Ambulatory Visit: Payer: Self-pay

## 2020-12-31 DIAGNOSIS — M6281 Muscle weakness (generalized): Secondary | ICD-10-CM | POA: Diagnosis present

## 2020-12-31 DIAGNOSIS — R262 Difficulty in walking, not elsewhere classified: Secondary | ICD-10-CM

## 2020-12-31 DIAGNOSIS — G8929 Other chronic pain: Secondary | ICD-10-CM | POA: Diagnosis present

## 2020-12-31 DIAGNOSIS — M25562 Pain in left knee: Secondary | ICD-10-CM | POA: Insufficient documentation

## 2020-12-31 NOTE — Therapy (Signed)
[place] [place] [address] [place], [place], [address] Phone: [phone]   Fax:  [phone]  Physical Therapy Evaluation  Patient Details  Name: [name] MRN: [id number] Date of Birth: [birth year] Referring Provider (PT): Marygrace Drought   Encounter Date: 12/31/2020   PT End of Session - 12/31/20 0825     Visit Number 1    Number of Visits 12    Date for PT Re-Evaluation 02/25/21    Authorization Type cigna managed VL 20 - 13 used 7 remain    Authorization - Visit Number 1    Authorization - Number of Visits 7    Progress Note Due on Visit 10    PT Start Time 0830    PT Stop Time 0905    PT Time Calculation (min) 35 min    Activity Tolerance Patient tolerated treatment well    Behavior During Therapy [facility] for tasks assessed/performed             Past Medical History:  Diagnosis Date   Autoimmune hepatitis (HCC)     Past Surgical History:  Procedure Laterality Date   ABDOMINAL HYSTERECTOMY     CHOLECYSTECTOMY     KNEE SURGERY Bilateral    nerve root compression      There were no vitals filed for this visit.    Subjective Assessment - 12/31/20 0836     Subjective States that she is in a little bit of pain 6/10 but overall her pain hasn't been that bad. States that she has been on top of medications better and she hasn't had the same pain. States her right knee has been doing well.    Pertinent History 2 previous left knee surgeries when she was a teenager - hx of R TKA    Limitations Walking;Standing;Lifting    Patient Stated Goals to be able to normal life where she doesn't have to think about her knees wiht walking and shopping and stairs    Currently in Pain? Yes    Pain Score 6     Pain Orientation Left    Pain Descriptors / Indicators Aching;Tightness    Pain Type Surgical pain    Pain Frequency Intermittent    Aggravating Factors  movemnet    Pain Relieving Factors rest, medication                 [facility] PT Assessment - 12/31/20 0001       Assessment   Medical Diagnosis L TKA    Referring Provider (PT) Lindi Adie [facility]    Onset Date/Surgical Date 12/17/20    Next MD Visit 01/01/21      Balance Screen   Has the patient fallen in the past 6 months No      Prior Function   Level of Independence Independent      Cognition   Overall Cognitive Status Within Functional Limits for tasks assessed      Observation/Other Assessments   Observations zip ties in place, no redenss, mild swelling and brusing,    Focus on Therapeutic Outcomes (FOTO)  30% function      ROM / Strength   AROM / PROM / Strength AROM;Strength      AROM   AROM Assessment Site Knee    Right/Left Knee Left;Right    Right Knee Extension 10   lacking   Right Knee Flexion 125    Left Knee Extension 15   lacking   Left Knee Flexion  85      Strength   Overall Strength Comments able to perform SLR without additional lag    Strength Assessment Site Knee    Right/Left Knee Right;Left      Palpation   Palpation comment no tenderness noted in left knee      Special Tests   Other special tests negative homan's sign      Ambulation/Gait   Ambulation/Gait Yes    Ambulation/Gait Assistance 6: Modified independent (Device/Increase time)    Ambulation Distance (Feet) 318 Feet    Assistive device Rolling walker    Gait Pattern Decreased hip/knee flexion - left;Decreased stride length;Decreased stance time - left    Ambulation Surface Level;Indoor    Gait velocity reduced    Gait Comments                        Objective measurements completed on examination: See above findings.       [facility] Adult PT Treatment/Exercise - 12/31/20 0001       Exercises   Exercises Knee/Hip      Knee/Hip Exercises: Aerobic   Stationary Bike 4 minutes - rocking back and forth - verbal cues from PT to point foot into PF      Knee/Hip Exercises: Seated   Other Seated Knee/Hip Exercises knee  flexion/extension x15 L      Knee/Hip Exercises: Supine   Heel Slides Left;10 reps   10" holds   Straight Leg Raises 10 reps;Left   slow and controlled                    PT Education - 12/31/20 0907     Education Details on current condition, on HEP on POC    Person(s) Educated Patient    Methods Explanation    Comprehension Verbalized understanding              PT Short Term Goals - 12/31/20 0857       PT SHORT TERM GOAL #1   Title Patient will demonstrate at least 10-115 degrees of leftknee ROM    Time 4    Period Weeks    Status New    Target Date 01/28/21      PT SHORT TERM GOAL #2   Title Patient will report at least 50% improvement in overall symptoms and/or function to demonstrate improved functional mobility    Target Date 01/28/21      PT SHORT TERM GOAL #3   Title Patient will be independent in self management strategies to improve quality of life and functional outcomes.    Time 4    Period Weeks    Status New    Target Date 01/28/21               PT Long Term Goals - 12/31/20 0906       PT LONG TERM GOAL #1   Title Patient will be able to ambulate at least 300 feet without an assistive device in 2 minutes to demonstrate improved walking mobility    Time 8    Period Weeks    Status New    Target Date 02/25/21      PT LONG TERM GOAL #2   Title Patient will improve on FOTO score to meet predicted outcomes to demonstrate improved functional mobility.    Time 8    Period Weeks    Status New    Target Date 02/25/21  PT LONG TERM GOAL #3   Title Patient will report at least 75% improvement in overall symptoms and/or function to demonstrate improved functional mobility    Time 8    Period Weeks    Status New    Target Date 02/25/21      PT LONG TERM GOAL #4   Title Patient will be able to ascend and descend 4 stairs with use of railing as needed with reciprocal gait pattern to demonstrate improved stair navigation    Time  8    Period Weeks    Status New    Target Date 02/25/21                    Plan - 12/31/20 0907     Clinical Impression Statement Patient is a 55  y.o. female who presents to physical therapy with complaint of left knee pain after left TKA on 12/17/20. Patient with long history of limited knee ROM and which will affect overall knee ROM patient achieves during rehab. Patient demonstrates decreased strength, ROM restriction, balance deficits and gait abnormalities which are likely contributing to symptoms of pain and are negatively impacting patient ability to perform ADLs and functional mobility tasks. Patient will benefit from skilled physical therapy services to address these deficits to reduce pain, improve level of function with ADLs, functional mobility tasks, and reduce risk for falls.    Personal Factors and Comorbidities Comorbidity 1;Comorbidity 2    Comorbidities hx of 2 previsous left knee surgeries, history of limited knee extension bilaterally long term    Examination-Activity Limitations Squat;Stairs;Stand;Transfers;Locomotion Level    Examination-Participation Restrictions Meal Prep;Laundry;Community Activity;Driving;Shop;Cleaning    Stability/Clinical Decision Making Stable/Uncomplicated    Clinical Decision Making Low    Rehab Potential Good    PT Frequency Other (comment)   1-2x/week for total of 12 visists over 8 week certification   PT Duration 8 weeks    PT Treatment/Interventions ADLs/Self Care Home Management;Electrical Stimulation;Cryotherapy;Moist Heat;Therapeutic exercise;Therapeutic activities;Manual techniques;Gait training;Stair training;DME Instruction;Neuromuscular re-education;Patient/family education;Dry needling;Joint Manipulations    PT Next Visit Plan Knee ROM, LE strengthening    PT Home Exercise Plan heel slides, TKE supine, SLR    Consulted and Agree with Plan of Care Patient             Patient will benefit from skilled therapeutic  intervention in order to improve the following deficits and impairments:  Pain, Difficulty walking, Decreased mobility, Decreased range of motion, Decreased activity tolerance, Decreased skin integrity, Decreased strength, Decreased scar mobility, Decreased knowledge of use of DME  Visit Diagnosis: Difficulty in walking, not elsewhere classified - Plan: PT plan of care cert/re-cert  Muscle weakness (generalized) - Plan: PT plan of care cert/re-cert  Chronic pain of left knee - Plan: PT plan of care cert/re-cert     Problem List There are no problems to display for this patient.  9:13 AM, 12/31/20 Tereasa Coop, DPT Physical Therapy with [facility]  [phone] office   [facility] [address] [place], [place], [address] Phone: [phone]   Fax:  [phone]  Name: [name] MRN: [id number] Date of Birth: 1965/08/08

## 2021-01-02 ENCOUNTER — Encounter (HOSPITAL_COMMUNITY): Payer: Self-pay | Admitting: Physical Therapy

## 2021-01-07 ENCOUNTER — Other Ambulatory Visit: Payer: Self-pay

## 2021-01-07 ENCOUNTER — Ambulatory Visit (HOSPITAL_COMMUNITY): Payer: Managed Care, Other (non HMO)

## 2021-01-07 DIAGNOSIS — G8929 Other chronic pain: Secondary | ICD-10-CM

## 2021-01-07 DIAGNOSIS — R262 Difficulty in walking, not elsewhere classified: Secondary | ICD-10-CM

## 2021-01-07 DIAGNOSIS — M6281 Muscle weakness (generalized): Secondary | ICD-10-CM

## 2021-01-07 NOTE — Therapy (Signed)
[facility] [address] [place], [place], [address] Phone: [phone]   Fax:  [phone]  Physical Therapy Treatment  Patient Details  Name: [name] MRN: [id number] Date of Birth: 08/01/1965 Referring Provider (PT): Marygrace Drought   Encounter Date: 01/07/2021   PT End of Session - 01/07/21 0853     Visit Number 2    Number of Visits 12    Date for PT Re-Evaluation 02/25/21    Authorization Type cigna managed VL 20 - 13 used 7 remain    Authorization - Visit Number 2    Authorization - Number of Visits 7    Progress Note Due on Visit 10    PT Start Time 0900    PT Stop Time 0945    PT Time Calculation (min) 45 min    Activity Tolerance Patient tolerated treatment well    Behavior During Therapy [facility] for tasks assessed/performed             Past Medical History:  Diagnosis Date   Autoimmune hepatitis (HCC)     Past Surgical History:  Procedure Laterality Date   ABDOMINAL HYSTERECTOMY     CHOLECYSTECTOMY     KNEE SURGERY Bilateral    nerve root compression      There were no vitals filed for this visit.   Subjective Assessment - 01/07/21 0856     Subjective Going well and feeling better. Knee is tight but significantly better than before.    Pertinent History 2 previous left knee surgeries when she was a teenager - hx of R TKA    Limitations Walking;Standing;Lifting    Patient Stated Goals to be able to normal life where she doesn't have to think about her knees wiht walking and shopping and stairs    Currently in Pain? Yes    Pain Score 3     Pain Location Knee    Pain Orientation Left    Pain Descriptors / Indicators Tightness    Pain Type Surgical pain                [facility] PT Assessment - 01/07/21 0001       Assessment   Medical Diagnosis L TKA    Referring Provider (PT) Lindi [facility]ie [facility]    Onset Date/Surgical Date 12/17/20                           [facility]  PT Treatment/Exercise - 01/07/21 0001       Ambulation/Gait   Ambulation/Gait Yes    Ambulation/Gait Assistance 6: Modified independent (Device/Increase time)    Assistive device Straight cane    Gait Pattern Decreased hip/knee flexion - left;Decreased stride length;Decreased stance time - left    Ambulation Surface Level;Indoor    Gait velocity reduced    Gait Comments gait training to normalize pattern with use of cane to improve efficiency of gait.      Knee/Hip Exercises: Aerobic   Stationary Bike 4 minutes - rocking back and forth - verbal cues from PT to point foot into PF      Knee/Hip Exercises: Machines for Strengthening   Cybex Knee Flexion 4 plates, 3J00      Knee/Hip Exercises: Standing   Knee Flexion Strengthening;Left;2 sets;10 reps    Hip Flexion Stengthening;Both;2 sets;10 reps    Hip Flexion Limitations 3 lbs    Lateral Step Up Left;2 sets;10 reps;Step Height: 6";Hand Hold: 2  Forward Step Up Left;2 sets;10 reps;Step Height: 6";Hand Hold: 2      Knee/Hip Exercises: Seated   Long Arc Quad Strengthening;Both;2 sets;10 reps    Long Arc Quad Weight 3 lbs.      Knee/Hip Exercises: Supine   Heel Slides Left;AAROM;2 sets;10 reps    Knee Extension AROM;Left    Knee Extension Limitations 8 lacking    Knee Flexion AROM;Left    Knee Flexion Limitations 95 degrees                       PT Short Term Goals - 12/31/20 0857       PT SHORT TERM GOAL #1   Title Patient will demonstrate at least 10-115 degrees of leftknee ROM    Time 4    Period Weeks    Status New    Target Date 01/28/21      PT SHORT TERM GOAL #2   Title Patient will report at least 50% improvement in overall symptoms and/or function to demonstrate improved functional mobility    Target Date 01/28/21      PT SHORT TERM GOAL #3   Title Patient will be independent in self management strategies to improve quality of life and functional outcomes.    Time 4    Period Weeks    Status  New    Target Date 01/28/21               PT Long Term Goals - 12/31/20 0906       PT LONG TERM GOAL #1   Title Patient will be able to ambulate at least 300 feet without an assistive device in 2 minutes to demonstrate improved walking mobility    Time 8    Period Weeks    Status New    Target Date 02/25/21      PT LONG TERM GOAL #2   Title Patient will improve on FOTO score to meet predicted outcomes to demonstrate improved functional mobility.    Time 8    Period Weeks    Status New    Target Date 02/25/21      PT LONG TERM GOAL #3   Title Patient will report at least 75% improvement in overall symptoms and/or function to demonstrate improved functional mobility    Time 8    Period Weeks    Status New    Target Date 02/25/21      PT LONG TERM GOAL #4   Title Patient will be able to ascend and descend 4 stairs with use of railing as needed with reciprocal gait pattern to demonstrate improved stair navigation    Time 8    Period Weeks    Status New    Target Date 02/25/21                   Plan - 01/07/21 0945     Clinical Impression Statement Demonstrating very good effort in HEP and progressing left knee ROM and strength. Addition of functional strengthening for HEP usig step and body weight. 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  Continued sessions indicated to progress strength and ROM to improve gait and balance. Recomend use of cane vs RW    Personal Factors and Comorbidities Comorbidity 1;Comorbidity 2    Comorbidities hx of 2 previsous left knee surgeries, history of limited knee extension bilaterally long term    Examination-Activity Limitations Squat;Stairs;Stand;Transfers;Locomotion Level    Examination-Participation  Restrictions Meal Prep;Laundry;Community Activity;Driving;Shop;Cleaning    Stability/Clinical Decision Making Stable/Uncomplicated    Rehab Potential Good    PT Frequency Other (comment)    1-2x/week for total of 12 visists over 8 week certification   PT Duration 8 weeks    PT Treatment/Interventions ADLs/Self Care Home Management;Electrical Stimulation;Cryotherapy;Moist Heat;Therapeutic exercise;Therapeutic activities;Manual techniques;Gait training;Stair training;DME Instruction;Neuromuscular re-education;Patient/family education;Dry needling;Joint Manipulations    PT Next Visit Plan Knee ROM, LE strengthening    PT Home Exercise Plan heel slides, TKE supine, SLR    Consulted and Agree with Plan of Care Patient             Patient will benefit from skilled therapeutic intervention in order to improve the following deficits and impairments:  Pain, Difficulty walking, Decreased mobility, Decreased range of motion, Decreased activity tolerance, Decreased skin integrity, Decreased strength, Decreased scar mobility, Decreased knowledge of use of DME  Visit Diagnosis: Difficulty in walking, not elsewhere classified  Muscle weakness (generalized)  Chronic pain of left knee     Problem List There are no problems to display for this patient.   Dion Body, PT 01/07/2021, 9:47 AM  [facility] [address] [place], [place], [address] Phone: [phone]   Fax:  [phone]  Name: [name] MRN: [id number] Date of Birth: 1965/12/09

## 2021-01-10 ENCOUNTER — Ambulatory Visit (HOSPITAL_COMMUNITY): Payer: Managed Care, Other (non HMO) | Attending: Internal Medicine | Admitting: Physical Therapy

## 2021-01-10 ENCOUNTER — Other Ambulatory Visit: Payer: Self-pay

## 2021-01-10 ENCOUNTER — Encounter (HOSPITAL_COMMUNITY): Payer: Self-pay | Admitting: Physical Therapy

## 2021-01-10 DIAGNOSIS — R262 Difficulty in walking, not elsewhere classified: Secondary | ICD-10-CM | POA: Diagnosis present

## 2021-01-10 DIAGNOSIS — M25562 Pain in left knee: Secondary | ICD-10-CM | POA: Diagnosis present

## 2021-01-10 DIAGNOSIS — M6281 Muscle weakness (generalized): Secondary | ICD-10-CM

## 2021-01-10 DIAGNOSIS — G8929 Other chronic pain: Secondary | ICD-10-CM

## 2021-01-10 NOTE — Patient Instructions (Signed)
Access Code: [email or web address] URL: [email or web address] Date: 01/10/2021 Prepared by: Greig Castilla Shavone Nevers  Exercises Standing Knee Flexion (Mirrored) - 1 x daily - 7 x weekly - 2 sets - 10 reps Sit to Stand with Arms Crossed - 1 x daily - 7 x weekly - 2 sets - 10 reps

## 2021-01-10 NOTE — Therapy (Signed)
[facility] [address] [place], [place], [address] Phone: [phone]   Fax:  [phone]  Physical Therapy Treatment  Patient Details  Name: [name] MRN: [id number] Date of Birth: 1965/06/03 Referring Provider (PT): Marygrace Drought   Encounter Date: 01/10/2021   PT End of Session - 01/10/21 0742     Visit Number 3    Number of Visits 12    Date for PT Re-Evaluation 02/25/21    Authorization Type cigna managed VL 20 - 13 used 7 remain    Authorization - Visit Number 3    Authorization - Number of Visits 7    Progress Note Due on Visit 10    PT Start Time 0745    PT Stop Time 0825    PT Time Calculation (min) 40 min    Activity Tolerance Patient tolerated treatment well    Behavior During Therapy [facility] for tasks assessed/performed             Past Medical History:  Diagnosis Date   Autoimmune hepatitis (HCC)     Past Surgical History:  Procedure Laterality Date   ABDOMINAL HYSTERECTOMY     CHOLECYSTECTOMY     KNEE SURGERY Bilateral    nerve root compression      There were no vitals filed for this visit.   Subjective Assessment - 01/10/21 0743     Subjective Knee is feeling good. Has been able to get around without cane. Some soreness still.    Pertinent History 2 previous left knee surgeries when she was a teenager - hx of R TKA    Limitations Walking;Standing;Lifting    Patient Stated Goals to be able to normal life where she doesn't have to think about her knees wiht walking and shopping and stairs    Currently in Pain? Yes    Pain Score 3     Pain Location Knee    Pain Orientation Left    Pain Descriptors / Indicators Aching    Pain Type Surgical pain    Pain Onset 1 to 4 weeks ago    Pain Frequency Intermittent                               [facility] Adult PT Treatment/Exercise - 01/10/21 0001       Knee/Hip Exercises: Aerobic   Stationary Bike 4 minutes - rocking back  and forth - verbal cues from PT to point foot into PF      Knee/Hip Exercises: Standing   Knee Flexion Strengthening;2 sets;10 reps;Both    Knee Flexion Limitations 3#    Lateral Step Up Left;2 sets;10 reps;Hand Hold: 2;Step Height: 4"    Lateral Step Up Limitations eccentric control    Forward Step Up Left;2 sets;10 reps;Hand Hold: 1;Step Height: 6"      Knee/Hip Exercises: Seated   Sit to Sand 2 sets;10 reps      Knee/Hip Exercises: Supine   Heel Slides Left;AAROM;10 reps;1 set    Heel Slides Limitations 5-10 second holds    Knee Extension AROM;Left    Knee Extension Limitations 8 lacking    Knee Flexion AROM;Left    Knee Flexion Limitations 95 degrees   improves to 100 following contract relax     Manual Therapy   Manual Therapy Muscle Energy Technique    Manual therapy comments Manual complete separate than rest of tx    Muscle Energy  Technique contract relax for flexion 10x 10 second holds                     PT Education - 01/10/21 0743     Education Details HEP, heel prop    Person(s) Educated Patient    Methods Explanation;Demonstration    Comprehension Verbalized understanding;Returned demonstration              PT Short Term Goals - 12/31/20 0857       PT SHORT TERM GOAL #1   Title Patient will demonstrate at least 10-115 degrees of leftknee ROM    Time 4    Period Weeks    Status New    Target Date 01/28/21      PT SHORT TERM GOAL #2   Title Patient will report at least 50% improvement in overall symptoms and/or function to demonstrate improved functional mobility    Target Date 01/28/21      PT SHORT TERM GOAL #3   Title Patient will be independent in self management strategies to improve quality of life and functional outcomes.    Time 4    Period Weeks    Status New    Target Date 01/28/21               PT Long Term Goals - 12/31/20 0906       PT LONG TERM GOAL #1   Title Patient will be able to ambulate at least 300  feet without an assistive device in 2 minutes to demonstrate improved walking mobility    Time 8    Period Weeks    Status New    Target Date 02/25/21      PT LONG TERM GOAL #2   Title Patient will improve on FOTO score to meet predicted outcomes to demonstrate improved functional mobility.    Time 8    Period Weeks    Status New    Target Date 02/25/21      PT LONG TERM GOAL #3   Title Patient will report at least 75% improvement in overall symptoms and/or function to demonstrate improved functional mobility    Time 8    Period Weeks    Status New    Target Date 02/25/21      PT LONG TERM GOAL #4   Title Patient will be able to ascend and descend 4 stairs with use of railing as needed with reciprocal gait pattern to demonstrate improved stair navigation    Time 8    Period Weeks    Status New    Target Date 02/25/21                   Plan - 01/10/21 0743     Clinical Impression Statement Began session with bike for dynamic warm up and ROM. Patient continues to demonstrate ROM from lacking 8 to 95. Patient tolerates contract relax well and ROM increases to 100 degrees following. Continued step training and patient demonstrating improving strength and motor control.  Requires cueing for foot positioning with STS. Educated on heel prop for extension ROM. Patient will continue to benefit from skilled physical therapy in order to reduce impairment and improve function.    Personal Factors and Comorbidities Comorbidity 1;Comorbidity 2    Comorbidities hx of 2 previsous left knee surgeries, history of limited knee extension bilaterally long term    Examination-Activity Limitations Squat;Stairs;Stand;Transfers;Locomotion Level    Examination-Participation Restrictions Meal Prep;Laundry;Community Activity;Driving;Shop;Cleaning  Stability/Clinical Decision Making Stable/Uncomplicated    Rehab Potential Good    PT Frequency Other (comment)   1-2x/week for total of 12 visists  over 8 week certification   PT Duration 8 weeks    PT Treatment/Interventions ADLs/Self Care Home Management;Electrical Stimulation;Cryotherapy;Moist Heat;Therapeutic exercise;Therapeutic activities;Manual techniques;Gait training;Stair training;DME Instruction;Neuromuscular re-education;Patient/family education;Dry needling;Joint Manipulations    PT Next Visit Plan Knee ROM, LE strengthening    PT Home Exercise Plan heel slides, TKE supine, SLR 12/1 STS, HS curls, heel prop    Consulted and Agree with Plan of Care Patient             Patient will benefit from skilled therapeutic intervention in order to improve the following deficits and impairments:  Pain, Difficulty walking, Decreased mobility, Decreased range of motion, Decreased activity tolerance, Decreased skin integrity, Decreased strength, Decreased scar mobility, Decreased knowledge of use of DME  Visit Diagnosis: Difficulty in walking, not elsewhere classified  Muscle weakness (generalized)  Chronic pain of left knee     Problem List There are no problems to display for this patient.   8:26 AM, 01/10/21 Wyman Songster PT, DPT Physical Therapist at [facility]   [facility] [address] [place], [place], [address] Phone: [phone]   Fax:  [phone]  Name: [name] MRN: [id number] Date of Birth: [birth year]

## 2021-01-14 ENCOUNTER — Other Ambulatory Visit: Payer: Self-pay

## 2021-01-14 ENCOUNTER — Encounter (HOSPITAL_COMMUNITY): Payer: Self-pay | Admitting: Physical Therapy

## 2021-01-14 ENCOUNTER — Ambulatory Visit (HOSPITAL_COMMUNITY): Payer: Managed Care, Other (non HMO) | Admitting: Physical Therapy

## 2021-01-14 DIAGNOSIS — R262 Difficulty in walking, not elsewhere classified: Secondary | ICD-10-CM

## 2021-01-14 DIAGNOSIS — G8929 Other chronic pain: Secondary | ICD-10-CM

## 2021-01-14 DIAGNOSIS — M6281 Muscle weakness (generalized): Secondary | ICD-10-CM

## 2021-01-14 DIAGNOSIS — M25562 Pain in left knee: Secondary | ICD-10-CM

## 2021-01-14 NOTE — Therapy (Signed)
[facility] [address] [place], [place], [address] Phone: [phone]   Fax:  [phone]  Physical Therapy Treatment  Patient Details  Name: [name] MRN: [id number] Date of Birth: 03/28/1965 Referring Provider (PT): Marygrace Drought   Encounter Date: 01/14/2021   PT End of Session - 01/14/21 0820     Visit Number 4    Number of Visits 12    Date for PT Re-Evaluation 02/25/21    Authorization Type cigna managed VL 20 - 13 used 7 remain    Authorization - Visit Number 4    Authorization - Number of Visits 7    Progress Note Due on Visit 10    PT Start Time 0745    PT Stop Time 0825    PT Time Calculation (min) 40 min    Activity Tolerance Patient tolerated treatment well    Behavior During Therapy [facility] for tasks assessed/performed             Past Medical History:  Diagnosis Date   Autoimmune hepatitis (HCC)     Past Surgical History:  Procedure Laterality Date   ABDOMINAL HYSTERECTOMY     CHOLECYSTECTOMY     KNEE SURGERY Bilateral    nerve root compression      There were no vitals filed for this visit.   Subjective Assessment - 01/14/21 0754     Subjective No pain 0/10. States she is doing well and is walking without her cane. States she has been reducing the amount a pain medication she has been taking.    Pertinent History 2 previous left knee surgeries when she was a teenager - hx of R TKA    Limitations Walking;Standing;Lifting    Patient Stated Goals to be able to normal life where she doesn't have to think about her knees wiht walking and shopping and stairs    Currently in Pain? Other (Comment)    Pain Onset 1 to 4 weeks ago                [facility] PT Assessment - 01/14/21 0001       Assessment   Medical Diagnosis L TKA    Referring Provider (PT) Lindi Adie [facility]    Onset Date/Surgical Date 12/17/20                           [facility] Adult PT Treatment/Exercise -  01/14/21 0001       Ambulation/Gait   Stairs Yes    Stairs Assistance 6: Modified independent (Device/Increase time);5: Supervision    Stair Management Technique Alternating pattern;No rails    Number of Stairs 7    Height of Stairs 4    Gait Comments x5, then 7 inch step with right rail use 4x3      Knee/Hip Exercises: Aerobic   Stationary Bike 5 minutes - rocking back and forth - verbal cues from PT to point foot into PF      Knee/Hip Exercises: Standing   Terminal Knee Extension Left   pulleys, 2 plates, 10 seconf hold 2 minutes forward and 2 minutes retro   Forward Step Up Left;2 sets;Step Height: 6";15 reps      Knee/Hip Exercises: Seated   Sit to Sand 15 reps   slow and controlled                    PT Education - 01/14/21 0762  Education Details on patient performing contract/relax at home with self and with husband    Person(s) Educated Patient    Methods Explanation    Comprehension Verbalized understanding              PT Short Term Goals - 12/31/20 0857       PT SHORT TERM GOAL #1   Title Patient will demonstrate at least 10-115 degrees of leftknee ROM    Time 4    Period Weeks    Status New    Target Date 01/28/21      PT SHORT TERM GOAL #2   Title Patient will report at least 50% improvement in overall symptoms and/or function to demonstrate improved functional mobility    Target Date 01/28/21      PT SHORT TERM GOAL #3   Title Patient will be independent in self management strategies to improve quality of life and functional outcomes.    Time 4    Period Weeks    Status New    Target Date 01/28/21               PT Long Term Goals - 12/31/20 0906       PT LONG TERM GOAL #1   Title Patient will be able to ambulate at least 300 feet without an assistive device in 2 minutes to demonstrate improved walking mobility    Time 8    Period Weeks    Status New    Target Date 02/25/21      PT LONG TERM GOAL #2   Title Patient  will improve on FOTO score to meet predicted outcomes to demonstrate improved functional mobility.    Time 8    Period Weeks    Status New    Target Date 02/25/21      PT LONG TERM GOAL #3   Title Patient will report at least 75% improvement in overall symptoms and/or function to demonstrate improved functional mobility    Time 8    Period Weeks    Status New    Target Date 02/25/21      PT LONG TERM GOAL #4   Title Patient will be able to ascend and descend 4 stairs with use of railing as needed with reciprocal gait pattern to demonstrate improved stair navigation    Time 8    Period Weeks    Status New    Target Date 02/25/21                   Plan - 01/14/21 0818     Clinical Impression Statement Improved strength noted on steps with reduced upper extremity use. Added TKE at pulley which was tolerated well but patient required verbal cues throughout as she tends to extend her hip instead of her knee when muscle fatigued. Generalized fatigue noted after exercises with increased sweating. Cued patient to take longer rest breaks and to listen to body. Advised patient to eat prior to coming in the AM to help break the fast from last nights dinner. Will continue with current POC.    Personal Factors and Comorbidities Comorbidity 1;Comorbidity 2    Comorbidities hx of 2 previsous left knee surgeries, history of limited knee extension bilaterally long term    Examination-Activity Limitations Squat;Stairs;Stand;Transfers;Locomotion Level    Examination-Participation Restrictions Meal Prep;Laundry;Community Activity;Driving;Shop;Cleaning    Stability/Clinical Decision Making Stable/Uncomplicated    Rehab Potential Good    PT Frequency Other (comment)   1-2x/week for total of  12 visists over 8 week certification   PT Duration 8 weeks    PT Treatment/Interventions ADLs/Self Care Home Management;Electrical Stimulation;Cryotherapy;Moist Heat;Therapeutic exercise;Therapeutic  activities;Manual techniques;Gait training;Stair training;DME Instruction;Neuromuscular re-education;Patient/family education;Dry needling;Joint Manipulations    PT Next Visit Plan Knee ROM, LE strengthening    PT Home Exercise Plan heel slides, TKE supine, SLR 12/1 STS, HS curls, heel prop    Consulted and Agree with Plan of Care Patient             Patient will benefit from skilled therapeutic intervention in order to improve the following deficits and impairments:  Pain, Difficulty walking, Decreased mobility, Decreased range of motion, Decreased activity tolerance, Decreased skin integrity, Decreased strength, Decreased scar mobility, Decreased knowledge of use of DME  Visit Diagnosis: Difficulty in walking, not elsewhere classified  Muscle weakness (generalized)  Chronic pain of left knee     Problem List There are no problems to display for this patient.  8:26 AM, 01/14/21 Tereasa Coop, DPT Physical Therapy with [facility]  [phone] office   [facility] [address] [place], [place], [address] Phone: [phone]   Fax:  [phone]  Name: [name] MRN: [id number] Date of Birth: 1965/10/19

## 2021-01-21 ENCOUNTER — Ambulatory Visit (HOSPITAL_COMMUNITY): Payer: Managed Care, Other (non HMO) | Admitting: Physical Therapy

## 2021-01-28 ENCOUNTER — Other Ambulatory Visit: Payer: Self-pay

## 2021-01-28 ENCOUNTER — Ambulatory Visit (HOSPITAL_COMMUNITY): Payer: Managed Care, Other (non HMO)

## 2021-01-28 DIAGNOSIS — R262 Difficulty in walking, not elsewhere classified: Secondary | ICD-10-CM | POA: Diagnosis not present

## 2021-01-28 DIAGNOSIS — M6281 Muscle weakness (generalized): Secondary | ICD-10-CM

## 2021-01-28 DIAGNOSIS — M25562 Pain in left knee: Secondary | ICD-10-CM

## 2021-01-28 NOTE — Therapy (Signed)
[facility] [address] [place], [place], [address] Phone: [phone]   Fax:  [phone]  Physical Therapy Treatment  Patient Details  Name: [name] MRN: [id number] Date of Birth: 06-11-1965 Referring Provider (PT): Marygrace Drought   Encounter Date: 01/28/2021   PT End of Session - 01/28/21 0901     Visit Number 5    Number of Visits 12    Date for PT Re-Evaluation 02/25/21    Authorization Type cigna managed VL 20 - 13 used 7 remain    Authorization - Visit Number 5    Authorization - Number of Visits 7    Progress Note Due on Visit 10    PT Start Time 0900    PT Stop Time 0945    PT Time Calculation (min) 45 min    Activity Tolerance Patient tolerated treatment well    Behavior During Therapy [facility] for tasks assessed/performed             Past Medical History:  Diagnosis Date   Autoimmune hepatitis (HCC)     Past Surgical History:  Procedure Laterality Date   ABDOMINAL HYSTERECTOMY     CHOLECYSTECTOMY     KNEE SURGERY Bilateral    nerve root compression      There were no vitals filed for this visit.   Subjective Assessment - 01/28/21 0912     Subjective Pt reports chief complaint of tightness along front of left knee, suprapatellar    Pertinent History 2 previous left knee surgeries when she was a teenager - hx of R TKA    Limitations Walking;Standing;Lifting    Patient Stated Goals to be able to normal life where she doesn't have to think about her knees wiht walking and shopping and stairs    Pain Onset 1 to 4 weeks ago                [facility] PT Assessment - 01/28/21 0001       Assessment   Medical Diagnosis L TKA    Referring Provider (PT) Lindi Adie [facility]    Onset Date/Surgical Date 12/17/20                           [facility] Adult PT Treatment/Exercise - 01/28/21 0001       Knee/Hip Exercises: Stretches   Knee: Self-Stretch to increase Flexion Left;Other  (comment)    Knee: Self-Stretch Limitations 10x3 sec      Knee/Hip Exercises: Standing   Knee Flexion Strengthening;Left;3 sets;10 reps    Knee Flexion Limitations 10#    Terminal Knee Extension Strengthening;Left;3 sets;10 reps    Terminal Knee Extension Limitations 4 plates    Lateral Step Up Left;2 sets;10 reps;Step Height: 8";Hand Hold: 2    Lateral Step Up Limitations eccentric control    Forward Step Up 2 sets;10 reps;Hand Hold: 0;Step Height: 8"    Walking with Sports Cord retro-forward x 2 min 4 plates      Knee/Hip Exercises: Seated   Long Arc Quad Strengthening;Left;3 sets;10 reps    Long Arc Quad Weight 10 lbs.                     PT Education - 01/28/21 0933     Education Details education/explanation of work:rest ration    Person(s) Educated Patient    Methods Explanation    Comprehension Verbalized understanding  PT Short Term Goals - 12/31/20 0857       PT SHORT TERM GOAL #1   Title Patient will demonstrate at least 10-115 degrees of leftknee ROM    Time 4    Period Weeks    Status New    Target Date 01/28/21      PT SHORT TERM GOAL #2   Title Patient will report at least 50% improvement in overall symptoms and/or function to demonstrate improved functional mobility    Target Date 01/28/21      PT SHORT TERM GOAL #3   Title Patient will be independent in self management strategies to improve quality of life and functional outcomes.    Time 4    Period Weeks    Status New    Target Date 01/28/21               PT Long Term Goals - 12/31/20 0906       PT LONG TERM GOAL #1   Title Patient will be able to ambulate at least 300 feet without an assistive device in 2 minutes to demonstrate improved walking mobility    Time 8    Period Weeks    Status New    Target Date 02/25/21      PT LONG TERM GOAL #2   Title Patient will improve on FOTO score to meet predicted outcomes to demonstrate improved functional mobility.     Time 8    Period Weeks    Status New    Target Date 02/25/21      PT LONG TERM GOAL #3   Title Patient will report at least 75% improvement in overall symptoms and/or function to demonstrate improved functional mobility    Time 8    Period Weeks    Status New    Target Date 02/25/21      PT LONG TERM GOAL #4   Title Patient will be able to ascend and descend 4 stairs with use of railing as needed with reciprocal gait pattern to demonstrate improved stair navigation    Time 8    Period Weeks    Status New    Target Date 02/25/21                   Plan - 01/28/21 0957     Clinical Impression Statement tolerating tx sessions well and rpogressing with ROM and left LE strength. Minimal compensation with stepping onto 8" height stair and progressing with symmetry in squat position. Able to ambulate without AD.  Educated on principles of tissue creep and progressive overload for lengthening/hypertrophy. Continued sessions to progress HEP and introducing gym-based activities    Personal Factors and Comorbidities Comorbidity 1;Comorbidity 2    Comorbidities hx of 2 previsous left knee surgeries, history of limited knee extension bilaterally long term    Examination-Activity Limitations Squat;Stairs;Stand;Transfers;Locomotion Level    Examination-Participation Restrictions Meal Prep;Laundry;Community Activity;Driving;Shop;Cleaning    Stability/Clinical Decision Making Stable/Uncomplicated    Rehab Potential Good    PT Frequency Other (comment)   1-2x/week for total of 12 visists over 8 week certification   PT Duration 8 weeks    PT Treatment/Interventions ADLs/Self Care Home Management;Electrical Stimulation;Cryotherapy;Moist Heat;Therapeutic exercise;Therapeutic activities;Manual techniques;Gait training;Stair training;DME Instruction;Neuromuscular re-education;Patient/family education;Dry needling;Joint Manipulations    PT Next Visit Plan Knee ROM, LE strengthening    PT Home  Exercise Plan heel slides, TKE supine, SLR 12/1 STS, HS curls, heel prop    Consulted and Agree with Plan of  Care Patient             Patient will benefit from skilled therapeutic intervention in order to improve the following deficits and impairments:  Pain, Difficulty walking, Decreased mobility, Decreased range of motion, Decreased activity tolerance, Decreased skin integrity, Decreased strength, Decreased scar mobility, Decreased knowledge of use of DME  Visit Diagnosis: Difficulty in walking, not elsewhere classified  Muscle weakness (generalized)  Chronic pain of left knee     Problem List There are no problems to display for this patient.   Dion Body, PT 01/28/2021, 10:00 AM  [facility] [address] [place], [place], [address] Phone: [phone]   Fax:  [phone]  Name: Shakisha Abend MRN: [id number] Date of Birth: [birth year]

## 2021-02-05 ENCOUNTER — Ambulatory Visit (HOSPITAL_COMMUNITY): Payer: Managed Care, Other (non HMO)

## 2021-02-05 ENCOUNTER — Other Ambulatory Visit: Payer: Self-pay

## 2021-02-05 DIAGNOSIS — M25562 Pain in left knee: Secondary | ICD-10-CM

## 2021-02-05 DIAGNOSIS — M6281 Muscle weakness (generalized): Secondary | ICD-10-CM

## 2021-02-05 DIAGNOSIS — R262 Difficulty in walking, not elsewhere classified: Secondary | ICD-10-CM

## 2021-02-05 NOTE — Therapy (Signed)
[facility] [address] [place], [place], [address] Phone: [phone]   Fax:  [phone]  Physical Therapy Treatment  Patient Details  Name: [name] MRN: [id number] Date of Birth: Sep 07, 1965 Referring Provider (PT): Marygrace Drought   Encounter Date: 02/05/2021   PT End of Session - 02/05/21 0923     Visit Number 6    Number of Visits 12    Date for PT Re-Evaluation 02/25/21    Authorization Type cigna managed VL 20 - 13 used 7 remain    Authorization - Visit Number 6    Authorization - Number of Visits 7    Progress Note Due on Visit 10    PT Start Time 0903    PT Stop Time 0945    PT Time Calculation (min) 42 min    Activity Tolerance Patient tolerated treatment well    Behavior During Therapy [facility] for tasks assessed/performed             Past Medical History:  Diagnosis Date   Autoimmune hepatitis (HCC)     Past Surgical History:  Procedure Laterality Date   ABDOMINAL HYSTERECTOMY     CHOLECYSTECTOMY     KNEE SURGERY Bilateral    nerve root compression      There were no vitals filed for this visit.   Subjective Assessment - 02/05/21 0919     Subjective Pt reports she had a fall to the front at home and has expereinced some right lateral thigh and groin pain since this time    Pertinent History 2 previous left knee surgeries when she was a teenager - hx of R TKA    Limitations Walking;Standing;Lifting    Patient Stated Goals to be able to normal life where she doesn't have to think about her knees wiht walking and shopping and stairs    Currently in Pain? Yes    Pain Score 3     Pain Location Knee    Pain Orientation Left    Pain Descriptors / Indicators Aching    Pain Type Surgical pain    Pain Onset 1 to 4 weeks ago                               [facility] Adult PT Treatment/Exercise - 02/05/21 0001       Knee/Hip Exercises: Stretches   Hip Flexor Stretch Right;3 reps;60  seconds    ITB Stretch Right;3 reps;60 seconds      Knee/Hip Exercises: Supine   Heel Slides AAROM;Left;3 sets;10 reps    Heel Slides Limitations 3 sec    Bridges Strengthening;Both;2 sets;10 reps    Straight Leg Raises Strengthening;Both;2 sets;10 reps    Knee Extension AROM;Left    Knee Extension Limitations 8 lacking    Knee Flexion AROM;Left    Knee Flexion Limitations 101                       PT Short Term Goals - 02/05/21 0945       PT SHORT TERM GOAL #1   Title Patient will demonstrate at least 10-115 degrees of leftknee ROM    Baseline 8-101    Time 4    Period Weeks    Status On-going    Target Date 01/28/21      PT SHORT TERM GOAL #2   Title Patient will report at least 50% improvement in  overall symptoms and/or function to demonstrate improved functional mobility    Status On-going    Target Date 01/28/21      PT SHORT TERM GOAL #3   Title Patient will be independent in self management strategies to improve quality of life and functional outcomes.    Time 4    Period Weeks    Status On-going    Target Date 01/28/21               PT Long Term Goals - 12/31/20 0906       PT LONG TERM GOAL #1   Title Patient will be able to ambulate at least 300 feet without an assistive device in 2 minutes to demonstrate improved walking mobility    Time 8    Period Weeks    Status New    Target Date 02/25/21      PT LONG TERM GOAL #2   Title Patient will improve on FOTO score to meet predicted outcomes to demonstrate improved functional mobility.    Time 8    Period Weeks    Status New    Target Date 02/25/21      PT LONG TERM GOAL #3   Title Patient will report at least 75% improvement in overall symptoms and/or function to demonstrate improved functional mobility    Time 8    Period Weeks    Status New    Target Date 02/25/21      PT LONG TERM GOAL #4   Title Patient will be able to ascend and descend 4 stairs with use of railing as  needed with reciprocal gait pattern to demonstrate improved stair navigation    Time 8    Period Weeks    Status New    Target Date 02/25/21                   Plan - 02/05/21 5631     Clinical Impression Statement RLE demo some discomfort with Scour's sign at hip, increased pain with resisted hip flexion, +FABER RLE, and +Ober's for ITB.  Pt notes her left knee is overall improving as well and stiffness in left knee being chief complaint. Overall improving in condition and demonstrating improved left knee ROM and strength. Continued sessions to progress to more intensive strengthening HEP    Personal Factors and Comorbidities Comorbidity 1;Comorbidity 2    Comorbidities hx of 2 previsous left knee surgeries, history of limited knee extension bilaterally long term    Examination-Activity Limitations Squat;Stairs;Stand;Transfers;Locomotion Level    Examination-Participation Restrictions Meal Prep;Laundry;Community Activity;Driving;Shop;Cleaning    Stability/Clinical Decision Making Stable/Uncomplicated    Rehab Potential Good    PT Frequency Other (comment)   1-2x/week for total of 12 visists over 8 week certification   PT Duration 8 weeks    PT Treatment/Interventions ADLs/Self Care Home Management;Electrical Stimulation;Cryotherapy;Moist Heat;Therapeutic exercise;Therapeutic activities;Manual techniques;Gait training;Stair training;DME Instruction;Neuromuscular re-education;Patient/family education;Dry needling;Joint Manipulations    PT Next Visit Plan Knee ROM, LE strengthening    PT Home Exercise Plan heel slides, TKE supine, SLR 12/1 STS, HS curls, heel prop    Consulted and Agree with Plan of Care Patient             Patient will benefit from skilled therapeutic intervention in order to improve the following deficits and impairments:  Pain, Difficulty walking, Decreased mobility, Decreased range of motion, Decreased activity tolerance, Decreased skin integrity, Decreased  strength, Decreased scar mobility, Decreased knowledge of use of DME  Visit Diagnosis:  Difficulty in walking, not elsewhere classified  Muscle weakness (generalized)  Chronic pain of left knee     Problem List There are no problems to display for this patient.   Dion Body, PT 02/05/2021, 9:46 AM  [facility] [address] [place], [place], [address] Phone: [phone]   Fax:  [phone]  Name: [name] MRN: [id number] Date of Birth: [birth year]

## 2021-02-05 NOTE — Telephone Encounter (Signed)
Pt called states her DME does not accept her insurance., pt requesting a call back.

## 2021-02-05 NOTE — Telephone Encounter (Signed)
I called pt.  She has not went forward with autopap, due to multiple surgeries.  [facility] not [facility].  Last see was in 05-2020.  She may require another FOV visit. But will move forward and see what they say and will be intouch as needed to get her going.  She appreciated call back.Marland Kitchen

## 2021-02-13 ENCOUNTER — Ambulatory Visit (HOSPITAL_COMMUNITY): Payer: Managed Care, Other (non HMO) | Attending: Internal Medicine

## 2021-02-13 ENCOUNTER — Other Ambulatory Visit: Payer: Self-pay

## 2021-02-13 DIAGNOSIS — M25561 Pain in right knee: Secondary | ICD-10-CM | POA: Insufficient documentation

## 2021-02-13 DIAGNOSIS — G8929 Other chronic pain: Secondary | ICD-10-CM | POA: Insufficient documentation

## 2021-02-13 DIAGNOSIS — M6281 Muscle weakness (generalized): Secondary | ICD-10-CM | POA: Diagnosis present

## 2021-02-13 DIAGNOSIS — R262 Difficulty in walking, not elsewhere classified: Secondary | ICD-10-CM | POA: Diagnosis present

## 2021-02-13 DIAGNOSIS — M25562 Pain in left knee: Secondary | ICD-10-CM | POA: Diagnosis present

## 2021-02-13 NOTE — Therapy (Signed)
[place], [place], [address] Phone: [phone]   Fax:  [phone]  Physical Therapy Treatment  Patient Details  Name: [name] MRN: [id number] Date of Birth: 17-Jan-1966 Referring Provider (PT): Audelia Hives   Encounter Date: 02/13/2021   PT End of Session - 02/13/21 0733     Visit Number 7    Number of Visits 12    Date for PT Re-Evaluation 02/25/21    Authorization Type cigna managed VL 20    Authorization - Visit Number 1    Authorization - Number of Visits 20    Progress Note Due on Visit 10    PT Start Time 0732    PT Stop Time 0815    PT Time Calculation (min) 43 min    Activity Tolerance Patient tolerated treatment well    Behavior During Therapy [facility] for tasks assessed/performed             Past Medical History:  Diagnosis Date   Autoimmune hepatitis ([place])     Past Surgical History:  Procedure Laterality Date   ABDOMINAL HYSTERECTOMY     CHOLECYSTECTOMY     KNEE SURGERY Bilateral    nerve root compression      There were no vitals filed for this visit.   Subjective Assessment - 02/13/21 0745     Subjective Continued right groin/buttock pain since the fall.  Left knee continues to feel stiff    Pertinent History 2 previous left knee surgeries when she was a teenager - hx of R TKA    Limitations Walking;Standing;Lifting    Patient Stated Goals to be able to normal life where she doesn't have to think about her knees wiht walking and shopping and stairs    Currently in Pain? Yes    Pain Score 3     Pain Location Knee    Pain Orientation Left    Pain Descriptors / Indicators Sore    Pain Onset 1 to 4 weeks ago                [facility] PT Assessment - 02/13/21 0001       Assessment   Medical Diagnosis L TKA    Referring Provider (PT) Alinda Money [facility]    Onset Date/Surgical Date 12/17/20                           [facility] Adult PT  Treatment/Exercise - 02/13/21 0001       Knee/Hip Exercises: Standing   Terminal Knee Extension Strengthening;Left;3 sets;10 reps    Terminal Knee Extension Limitations 4 plates    Lateral Step Up 2 sets;10 reps;Step Height: 6";Hand Hold: 0    Forward Step Up 2 sets;10 reps;Step Height: 6";Hand Hold: 0    Rocker Board 4 minutes   2 min ant-post, 2 min lateral   Other Standing Knee Exercises static/dynamic paloff on airex 3x10 against blue t-band      Knee/Hip Exercises: Seated   Long Arc Quad Strengthening;Left;3 sets;10 reps    Long Arc Quad Weight 10 lbs.      Knee/Hip Exercises: Supine   Knee Extension AROM;Left    Knee Extension Limitations 6 lacking    Knee Flexion AROM;Left    Knee Flexion Limitations 105                       PT Short Term Goals -  02/05/21 0945       PT SHORT TERM GOAL #1   Title Patient will demonstrate at least 10-115 degrees of leftknee ROM    Baseline 8-101    Time 4    Period Weeks    Status On-going    Target Date 01/28/21      PT SHORT TERM GOAL #2   Title Patient will report at least 50% improvement in overall symptoms and/or function to demonstrate improved functional mobility    Status On-going    Target Date 01/28/21      PT SHORT TERM GOAL #3   Title Patient will be independent in self management strategies to improve quality of life and functional outcomes.    Time 4    Period Weeks    Status On-going    Target Date 01/28/21               PT Long Term Goals - 12/31/20 0906       PT LONG TERM GOAL #1   Title Patient will be able to ambulate at least 300 feet without an assistive device in 2 minutes to demonstrate improved walking mobility    Time 8    Period Weeks    Status New    Target Date 02/25/21      PT LONG TERM GOAL #2   Title Patient will improve on FOTO score to meet predicted outcomes to demonstrate improved functional mobility.    Time 8    Period Weeks    Status New    Target Date  02/25/21      PT LONG TERM GOAL #3   Title Patient will report at least 75% improvement in overall symptoms and/or function to demonstrate improved functional mobility    Time 8    Period Weeks    Status New    Target Date 02/25/21      PT LONG TERM GOAL #4   Title Patient will be able to ascend and descend 4 stairs with use of railing as needed with reciprocal gait pattern to demonstrate improved stair navigation    Time 8    Period Weeks    Status New    Target Date 02/25/21                   Plan - 02/13/21 [id number]     Clinical Impression Statement Improving LLE eccentric control appreciated with ability to perform step downs without UE support. Continued sessions to improve LLE ROM/strength to normalize gait/balance to reduce risk for falls    Personal Factors and Comorbidities Comorbidity 1;Comorbidity 2    Comorbidities hx of 2 previsous left knee surgeries, history of limited knee extension bilaterally long term    Examination-Activity Limitations Squat;Stairs;Stand;Transfers;Locomotion Level    Examination-Participation Restrictions Meal Prep;Laundry;Community Activity;Driving;Shop;Cleaning    Stability/Clinical Decision Making Stable/Uncomplicated    Rehab Potential Good    PT Frequency Other (comment)   1-2x/week for total of 12 visists over 8 week certification   PT Duration 8 weeks    PT Treatment/Interventions ADLs/Self Care Home Management;Electrical Stimulation;Cryotherapy;Moist Heat;Therapeutic exercise;Therapeutic activities;Manual techniques;Gait training;Stair training;DME Instruction;Neuromuscular re-education;Patient/family education;Dry needling;Joint Manipulations    PT Next Visit Plan Knee ROM, LE strengthening    PT Home Exercise Plan heel slides, TKE supine, SLR 12/1 STS, HS curls, heel prop    Consulted and Agree with Plan of Care Patient             Patient will benefit from skilled therapeutic intervention  in order to improve the following  deficits and impairments:  Pain, Difficulty walking, Decreased mobility, Decreased range of motion, Decreased activity tolerance, Decreased skin integrity, Decreased strength, Decreased scar mobility, Decreased knowledge of use of DME  Visit Diagnosis: Difficulty in walking, not elsewhere classified  Muscle weakness (generalized)  Chronic pain of left knee     Problem List There are no problems to display for this patient.   Toniann Fail, PT 02/13/2021, 8:24 AM  [facility] [address] [place], [place], [address] Phone: [phone]   Fax:  [phone]  Name: [name] MRN: [id number] Date of Birth: 08/15/1965

## 2021-02-20 ENCOUNTER — Encounter (HOSPITAL_COMMUNITY): Payer: Managed Care, Other (non HMO)

## 2021-02-20 NOTE — Telephone Encounter (Signed)
Ross Ludwig, RN; Dimas Millin got it      Previous Messages   ----- Message -----  From: Guy Begin, RN  Sent: 02/05/2021   4:00 PM EST  To: Dimas Millin, Jari Favre, *  Subject: new autopap user                               See order  in Epic.  Last seen 05-2020, (was never started on autopap due to multiple surgeries, other DME does not take her insurance.  She lives in [place].   Darrick Penna "Lulabelle Desta"  Female, 56 y.o., [birth year]  MRN:  [id number]  Phone:  [phone]  Thank you.     Electronics engineer

## 2021-02-27 ENCOUNTER — Encounter (HOSPITAL_COMMUNITY): Payer: Managed Care, Other (non HMO)

## 2021-02-27 ENCOUNTER — Telehealth: Payer: Self-pay | Admitting: Neurology

## 2021-02-27 NOTE — Telephone Encounter (Signed)
LVM for pt to call [facility] to schedule an appt

## 2021-02-28 NOTE — Telephone Encounter (Signed)
LVM for pt to call [facility] to schedule Initial CPAP follow up

## 2021-03-06 ENCOUNTER — Ambulatory Visit (HOSPITAL_COMMUNITY): Payer: Managed Care, Other (non HMO)

## 2021-03-06 ENCOUNTER — Other Ambulatory Visit: Payer: Self-pay

## 2021-03-06 DIAGNOSIS — G8929 Other chronic pain: Secondary | ICD-10-CM

## 2021-03-06 DIAGNOSIS — R262 Difficulty in walking, not elsewhere classified: Secondary | ICD-10-CM | POA: Diagnosis not present

## 2021-03-06 DIAGNOSIS — M6281 Muscle weakness (generalized): Secondary | ICD-10-CM

## 2021-03-06 NOTE — Therapy (Signed)
[name] [address] [place], [place], [address] Phone: [phone]   Fax:  [phone]  Physical Therapy Treatment and D/C Summary   Patient Details  Name: [name] MRN: [id number] Date of Birth: October 08, 1965 Referring Provider (PT): [place] SUMMARY  Visits from Start of Care: 8  Current functional level related to goals / functional outcomes: Prorgessed very well with Poc details and demo improved left knee ROM and strength with normalized gait pattern   Remaining deficits: Some limitation in left knee flexion   Education / Equipment: HEP   Patient agrees to discharge. Patient goals were met. Patient is being discharged due to meeting the stated rehab goals.  Encounter Date: 03/06/2021   PT End of Session - 03/06/21 0900     Visit Number 8    Number of Visits 12    Date for PT Re-Evaluation 02/25/21    Authorization Type cigna managed VL 20    Authorization - Visit Number 1    Authorization - Number of Visits 20    Progress Note Due on Visit 10    PT Start Time 0900    PT Stop Time 0930   D/C visit assessment   PT Time Calculation (min) 30 min    Activity Tolerance Patient tolerated treatment well    Behavior During Therapy WFL for tasks assessed/performed             Past Medical History:  Diagnosis Date   Autoimmune hepatitis ([place])     Past Surgical History:  Procedure Laterality Date   ABDOMINAL HYSTERECTOMY     CHOLECYSTECTOMY     KNEE SURGERY Bilateral    nerve root compression      There were no vitals filed for this visit.   Subjective Assessment - 03/06/21 0905     Subjective Still limited by right hip and unable to work left knee as vigorously    Pertinent History 2 previous left knee surgeries when she was a teenager - hx of R TKA    Limitations Walking;Standing;Lifting    Patient Stated Goals to be able to normal life where she doesn't have to  think about her knees wiht walking and shopping and stairs    Currently in Pain? No/denies    Pain Score 0-No pain    Pain Location Knee    Pain Orientation Left    Pain Descriptors / Indicators Tightness    Pain Type Surgical pain    Pain Onset 1 to 4 weeks ago                [facility] PT Assessment - 03/06/21 0001       Assessment   Medical Diagnosis L TKA    Referring Provider (PT) [facility]    Onset Date/Surgical Date 12/17/20      Observation/Other Assessments   Focus on Therapeutic Outcomes (FOTO)  90.6% function      AROM   Left Knee Extension 0   springy end-feel   Left Knee Flexion 112      Strength   Left Knee Flexion 5/5    Left Knee Extension 5/5      Ambulation/Gait   Ambulation/Gait Yes    Ambulation/Gait Assistance 7: Independent    Ambulation Distance (Feet) 475 Feet    Assistive device None    Gait Pattern Within Functional Limits    Ambulation Surface Level    Gait velocity normal  Stairs Yes    Stairs Assistance 7: Independent    Stair Management Technique Alternating pattern;No rails    Number of Stairs 12    Gait Comments 2MWT                           [facility] Adult PT Treatment/Exercise - 03/06/21 0001       Knee/Hip Exercises: Stretches   Quad Stretch Left;1 rep;60 seconds      Knee/Hip Exercises: Supine   Quad Sets Strengthening;Left;3 sets;10 reps    Heel Slides AROM;Left;2 sets;10 reps    Heel Slides Limitations 3 sec    Straight Leg Raises Strengthening;Left;2 sets;10 reps                       PT Short Term Goals - 03/06/21 0907       PT SHORT TERM GOAL #1   Title Patient will demonstrate at least 10-115 degrees of leftknee ROM    Baseline 0-112    Time 4    Period Weeks    Status Partially Met    Target Date 01/28/21      PT SHORT TERM GOAL #2   Title Patient will report at least 50% improvement in overall symptoms and/or function to demonstrate improved functional mobility     Baseline 80% improvement left knee    Status Achieved    Target Date 01/28/21      PT SHORT TERM GOAL #3   Title Patient will be independent in self management strategies to improve quality of life and functional outcomes.    Time 4    Period Weeks    Status Achieved    Target Date 01/28/21               PT Long Term Goals - 03/06/21 0914       PT LONG TERM GOAL #1   Title Patient will be able to ambulate at least 300 feet without an assistive device in 2 minutes to demonstrate improved walking mobility    Baseline 475 ft with normalized gait pattern    Time 8    Period Weeks    Status Achieved    Target Date 02/25/21      PT LONG TERM GOAL #2   Title Patient will improve on FOTO score to meet predicted outcomes to demonstrate improved functional mobility.    Baseline 90.6% function    Time 8    Period Weeks    Status Achieved    Target Date 02/25/21      PT LONG TERM GOAL #3   Title Patient will report at least 75% improvement in overall symptoms and/or function to demonstrate improved functional mobility    Baseline 80% improved    Time 8    Period Weeks    Status Achieved    Target Date 02/25/21      PT LONG TERM GOAL #4   Title Patient will be able to ascend and descend 4 stairs with use of railing as needed with reciprocal gait pattern to demonstrate improved stair navigation    Baseline reciprocal pattern independent    Time 8    Period Weeks    Status Achieved    Target Date 02/25/21                   Plan - 03/06/21 0956     Clinical Impression Statement Able to meet STG/LTG and  feels comfortable in self-progression of activities for left knee and demonstrates improved ROM and normalized gait pattern and stair mgmt without deviation or limitation.  Pt has had new right hip injury that has appeared that has been most limiting factor. D/C to HEP at this time for left knee    Personal Factors and Comorbidities Comorbidity 1;Comorbidity 2     Comorbidities hx of 2 previsous left knee surgeries, history of limited knee extension bilaterally long term    Examination-Activity Limitations Squat;Stairs;Stand;Transfers;Locomotion Level    Examination-Participation Restrictions Meal Prep;Laundry;Community Activity;Driving;Shop;Cleaning    Stability/Clinical Decision Making Stable/Uncomplicated    Rehab Potential Good    PT Frequency Other (comment)   1-2x/week for total of 12 visists over 8 week certification   PT Duration 8 weeks    PT Treatment/Interventions ADLs/Self Care Home Management;Electrical Stimulation;Cryotherapy;Moist Heat;Therapeutic exercise;Therapeutic activities;Manual techniques;Gait training;Stair training;DME Instruction;Neuromuscular re-education;Patient/family education;Dry needling;Joint Manipulations    PT Next Visit Plan Knee ROM, LE strengthening    PT Home Exercise Plan heel slides, TKE supine, SLR 12/1 STS, HS curls, heel prop    Consulted and Agree with Plan of Care Patient             Patient will benefit from skilled therapeutic intervention in order to improve the following deficits and impairments:  Pain, Difficulty walking, Decreased mobility, Decreased range of motion, Decreased activity tolerance, Decreased skin integrity, Decreased strength, Decreased scar mobility, Decreased knowledge of use of DME  Visit Diagnosis: Difficulty in walking, not elsewhere classified  Muscle weakness (generalized)  Chronic pain of right knee     Problem List There are no problems to display for this patient.   Toniann Fail, PT 03/06/2021, 10:01 AM  [place], [place], [address] Phone: [phone]   Fax:  [phone]  Name: [name] MRN: [id number] Date of Birth: 05/06/1965

## 2021-03-12 ENCOUNTER — Ambulatory Visit
Admission: RE | Admit: 2021-03-12 | Discharge: 2021-03-12 | Disposition: A | Discharge status: Home / Self Care | Payer: Managed Care, Other (non HMO) | Source: Ambulatory Visit | Attending: Nurse Practitioner | Admitting: Nurse Practitioner

## 2021-03-12 DIAGNOSIS — K7400 Hepatic fibrosis, unspecified: Secondary | ICD-10-CM

## 2021-03-12 DIAGNOSIS — K760 Fatty (change of) liver, not elsewhere classified: Secondary | ICD-10-CM

## 2021-03-12 DIAGNOSIS — K754 Autoimmune hepatitis: Secondary | ICD-10-CM

## 2021-04-03 NOTE — Telephone Encounter (Signed)
Pt need help on how to use the CPAP machine. Using the CPAP is very painful, wake up with a headache. Have spoke with the DME. Would like a call from the nurse.

## 2021-04-04 NOTE — Telephone Encounter (Signed)
Spoke with patient. Patient complaining about her pressure on her machine . Pt states she has Dry mouth in am Pt states that she doesn't feel any air coming out her mask.  Dr Frances Furbish reviewed DL Per Dr Frances Furbish everything looks good on DL pt needs to call DME to get her Humidity  checked on CPAP . Spoke with patient informed her to call DME back to get her Humidity checked on CPAP . Pt thanked me for calling

## 2021-05-02 ENCOUNTER — Ambulatory Visit: Payer: Managed Care, Other (non HMO) | Admitting: Neurology

## 2021-05-02 ENCOUNTER — Encounter: Payer: Self-pay | Admitting: Neurology

## 2021-05-28 ENCOUNTER — Other Ambulatory Visit: Payer: Self-pay | Admitting: Physician Assistant

## 2021-07-11 ENCOUNTER — Other Ambulatory Visit (HOSPITAL_COMMUNITY): Payer: Self-pay | Admitting: Nurse Practitioner

## 2021-07-11 ENCOUNTER — Other Ambulatory Visit: Payer: Self-pay | Admitting: Nurse Practitioner

## 2021-07-11 DIAGNOSIS — K760 Fatty (change of) liver, not elsewhere classified: Secondary | ICD-10-CM

## 2021-07-11 DIAGNOSIS — K7400 Hepatic fibrosis, unspecified: Secondary | ICD-10-CM

## 2021-07-11 DIAGNOSIS — K754 Autoimmune hepatitis: Secondary | ICD-10-CM

## 2021-11-07 ENCOUNTER — Ambulatory Visit (HOSPITAL_COMMUNITY)
Admission: RE | Admit: 2021-11-07 | Discharge: 2021-11-07 | Disposition: A | Discharge status: Home / Self Care | Payer: BC Managed Care – PPO | Source: Ambulatory Visit | Attending: Nurse Practitioner | Admitting: Nurse Practitioner

## 2021-11-07 DIAGNOSIS — K754 Autoimmune hepatitis: Secondary | ICD-10-CM | POA: Insufficient documentation

## 2021-11-07 DIAGNOSIS — K7400 Hepatic fibrosis, unspecified: Secondary | ICD-10-CM | POA: Diagnosis present

## 2021-11-07 DIAGNOSIS — K760 Fatty (change of) liver, not elsewhere classified: Secondary | ICD-10-CM | POA: Diagnosis present

## 2022-02-02 ENCOUNTER — Ambulatory Visit
Admission: EM | Admit: 2022-02-02 | Discharge: 2022-02-02 | Discharge status: Absent without leave | Payer: BC Managed Care – PPO

## 2023-07-02 ENCOUNTER — Other Ambulatory Visit (HOSPITAL_COMMUNITY): Payer: Self-pay | Admitting: Nurse Practitioner

## 2023-07-02 DIAGNOSIS — K754 Autoimmune hepatitis: Secondary | ICD-10-CM

## 2023-07-02 DIAGNOSIS — D849 Immunodeficiency, unspecified: Secondary | ICD-10-CM

## 2023-07-02 DIAGNOSIS — K7400 Hepatic fibrosis, unspecified: Secondary | ICD-10-CM

## 2023-07-17 ENCOUNTER — Ambulatory Visit (HOSPITAL_COMMUNITY)
Admission: RE | Admit: 2023-07-17 | Discharge: 2023-07-17 | Disposition: A | Discharge status: Home / Self Care | Source: Ambulatory Visit | Attending: Nurse Practitioner | Admitting: Nurse Practitioner

## 2023-07-17 DIAGNOSIS — K7400 Hepatic fibrosis, unspecified: Secondary | ICD-10-CM | POA: Diagnosis present

## 2023-07-17 DIAGNOSIS — K754 Autoimmune hepatitis: Secondary | ICD-10-CM | POA: Diagnosis present

## 2023-07-17 DIAGNOSIS — D849 Immunodeficiency, unspecified: Secondary | ICD-10-CM | POA: Diagnosis present

## 2023-11-24 ENCOUNTER — Encounter (HOSPITAL_COMMUNITY): Payer: Self-pay

## 2023-11-24 NOTE — Progress Notes (Signed)
 Sent message, via epic in basket, requesting orders in epic from Careers adviser.

## 2023-11-25 NOTE — Progress Notes (Addendum)
 Anesthesia Review:  PCP: Tylene Meres , PA at [facility] in [place] 08/05/23  Cardiologist :  [facility]- for autoimmune hepatitis.    PPM/ ICD: Device Orders: Rep Notified:  Chest x-ray : EKG :12/01/2023  Echo : Stress test: Cardiac Cath :   Activity level:  Sleep Study/ CPAP : Fasting Blood Sugar :      / Checks Blood Sugar -- times a day:    Blood Thinner/ Instructions /Last Dose: ASA / Instructions/ Last Dose :    LVMM on 12/01/2023. At 0804am.  Hx of autoimmune hepatitis HX of ITP - pt staes she runs from 70-80 to 100 usually  ADHD  PT came for preop on 12/01/23 and was 24 minutes late for preop appt.  MEd hx and preop instructons completed.  Blood pressure readings in left were 187/137 and 177/128 and in right arm were 186/129 and 162/137.  Pt denies any chest pain shortness of breatrh , dizziness , headache or blurred vision.  PT does admit to having headache in back of head on 11/30/2023 but related to neck pain.  PT very talkative on preop visit.  P114-PT states My pulse always runs high.  PT stated she did take am meds this am.  EKG done.  Harlene Neomi Barre aware of all of above.  PT was given a copy of blood pressure readings and copy of EKG done 11/30/23 and instructed to notify pcp today.  PT voiced understandig  PT aware that if blood pressure is elevated DOS she will be cancelled.  PT voiced understanding.    CBC done 12/01/23- [facility] ct appears decreased- rotued to DR Marchwiany.  On 12/01/23.   [facility] aware.

## 2023-11-27 NOTE — Patient Instructions (Signed)
 SURGICAL WAITING ROOM VISITATION  Patients having surgery or a procedure may have no more than 2 support people in the waiting area - these visitors may rotate.    Children under the age of 7 must have an adult with them who is not the patient.  Visitors with respiratory illnesses are discouraged from visiting and should remain at home.  If the patient needs to stay at the hospital during part of their recovery, the visitor guidelines for inpatient rooms apply. Pre-op nurse will coordinate an appropriate time for 1 support person to accompany patient in pre-op.  This support person may not rotate.    Please refer to the [facility] website for the visitor guidelines for Inpatients (after your surgery is over and you are in a regular room).       Your procedure is scheduled on:  12/04/2023    Report to [facility] Main Entrance    Report to admitting at  0515 AM   Call this number if you have problems the morning of surgery [phone]   Do not eat food :After Midnight.   After Midnight you may have the following liquids until _ 0430_____ AM  DAY OF SURGERY  Water Non-Citrus Juices (without pulp, NO RED-Apple, White grape, White cranberry) Black Coffee (NO MILK/CREAM OR CREAMERS, sugar ok)  Clear Tea (NO MILK/CREAM OR CREAMERS, sugar ok) regular and decaf                             Plain Jell-O (NO RED)                                           Fruit ices (not with fruit pulp, NO RED)                                     Popsicles (NO RED)                                                               Sports drinks like Gatorade (NO RED)                     The day of surgery:  Drink ONE (1) Pre-Surgery Clear Ensure or G2 at 0430 AM the morning of surgery. Drink in one sitting. Do not sip.  This drink was given to you during your hospital  pre-op appointment visit. Nothing else to drink after completing the  Pre-Surgery Clear Ensure or G2.          If you have  questions, please contact your surgeon's office.       Oral Hygiene is also important to reduce your risk of infection.                                    Remember - BRUSH YOUR TEETH THE MORNING OF SURGERY WITH YOUR REGULAR TOOTHPASTE  DENTURES WILL BE REMOVED PRIOR TO SURGERY PLEASE DO NOT APPLY Poly grip  OR ADHESIVES!!!   Do NOT smoke after Midnight   Stop all vitamins and herbal supplements 7 days before surgery.   Take these medicines the morning of surgery with A SIP OF WATER:    DO NOT TAKE ANY ORAL DIABETIC MEDICATIONS DAY OF YOUR SURGERY  Bring CPAP mask and tubing day of surgery.                              You may not have any metal on your body including hair pins, jewelry, and body piercing             Do not wear make-up, lotions, powders, perfumes/cologne, or deodorant  Do not wear nail polish including gel and S&S, artificial/acrylic nails, or any other type of covering on natural nails including finger and toenails. If you have artificial nails, gel coating, etc. that needs to be removed by a nail salon please have this removed prior to surgery or surgery may need to be canceled/ delayed if the surgeon/ anesthesia feels like they are unable to be safely monitored.   Do not shave  48 hours prior to surgery.               Men may shave face and neck.   Do not bring valuables to the hospital. [facility] IS NOT             RESPONSIBLE   FOR VALUABLES.   Contacts, glasses, dentures or bridgework may not be worn into surgery.   Bring small overnight bag day of surgery.   DO NOT BRING YOUR HOME MEDICATIONS TO THE HOSPITAL. PHARMACY WILL DISPENSE MEDICATIONS LISTED ON YOUR MEDICATION LIST TO YOU DURING YOUR ADMISSION IN THE HOSPITAL!    Patients discharged on the day of surgery will not be allowed to drive home.  Someone NEEDS to stay with you for the first 24 hours after anesthesia.   Special Instructions: Bring a copy of your healthcare power of attorney and  living will documents the day of surgery if you haven't scanned them before.              Please read over the following fact sheets you were given: IF YOU HAVE QUESTIONS ABOUT YOUR PRE-OP INSTRUCTIONS PLEASE CALL [phone].   If you received a COVID test during your pre-op visit  it is requested that you wear a mask when out in public, stay away from anyone that may not be feeling well and notify your surgeon if you develop symptoms. If you test positive for Covid or have been in contact with anyone that has tested positive in the last 10 days please notify you surgeon.      Pre-operative 4 CHG Bath Instructions   You can play a key role in reducing the risk of infection after surgery. Your skin needs to be as free of germs as possible. You can reduce the number of germs on your skin by washing with CHG (chlorhexidine gluconate) soap before surgery. CHG is an antiseptic soap that kills germs and continues to kill germs even after washing.   DO NOT use if you have an allergy to chlorhexidine/CHG or antibacterial soaps. If your skin becomes reddened or irritated, stop using the CHG and notify one of our RNs at [phone].   Please shower with the CHG soap starting 4 days before surgery using the following schedule:     Please keep in mind the following:  DO NOT shave, including legs and underarms, starting the day of your first shower.   You may shave your face at any point before/day of surgery.  Place clean sheets on your bed the day you start using CHG soap. Use a clean washcloth (not used since being washed) for each shower. DO NOT sleep with pets once you start using the CHG.   CHG Shower Instructions:  If you choose to wash your hair and private area, wash first with your normal shampoo/soap.  After you use shampoo/soap, rinse your hair and body thoroughly to remove shampoo/soap residue.  Turn the water OFF and apply about 3 tablespoons (45 ml) of CHG soap to a CLEAN washcloth.   Apply CHG soap ONLY FROM YOUR NECK DOWN TO YOUR TOES (washing for 3-5 minutes)  DO NOT use CHG soap on face, private areas, open wounds, or sores.  Pay special attention to the area where your surgery is being performed.  If you are having back surgery, having someone wash your back for you may be helpful. Wait 2 minutes after CHG soap is applied, then you may rinse off the CHG soap.  Pat dry with a clean towel  Put on clean clothes/pajamas   If you choose to wear lotion, please use ONLY the CHG-compatible lotions on the back of this paper.     Additional instructions for the day of surgery: DO NOT APPLY any lotions, deodorants, cologne, or perfumes.   Put on clean/comfortable clothes.  Brush your teeth.  Ask your nurse before applying any prescription medications to the skin.      CHG Compatible Lotions   Aveeno Moisturizing lotion  Cetaphil Moisturizing Cream  Cetaphil Moisturizing Lotion  Clairol Herbal Essence Moisturizing Lotion, Dry Skin  Clairol Herbal Essence Moisturizing Lotion, Extra Dry Skin  Clairol Herbal Essence Moisturizing Lotion, Normal Skin  Curel Age Defying Therapeutic Moisturizing Lotion with Alpha Hydroxy  Curel Extreme Care Body Lotion  Curel Soothing Hands Moisturizing Hand Lotion  Curel Therapeutic Moisturizing Cream, Fragrance-Free  Curel Therapeutic Moisturizing Lotion, Fragrance-Free  Curel Therapeutic Moisturizing Lotion, Original Formula  Eucerin Daily Replenishing Lotion  Eucerin Dry Skin Therapy Plus Alpha Hydroxy Crme  Eucerin Dry Skin Therapy Plus Alpha Hydroxy Lotion  Eucerin Original Crme  Eucerin Original Lotion  Eucerin Plus Crme Eucerin Plus Lotion  Eucerin TriLipid Replenishing Lotion  Keri Anti-Bacterial Hand Lotion  Keri Deep Conditioning Original Lotion Dry Skin Formula Softly Scented  Keri Deep Conditioning Original Lotion, Fragrance Free Sensitive Skin Formula  Keri Lotion Fast Absorbing Fragrance Free Sensitive Skin  Formula  Keri Lotion Fast Absorbing Softly Scented Dry Skin Formula  Keri Original Lotion  Keri Skin Renewal Lotion Keri Silky Smooth Lotion  Keri Silky Smooth Sensitive Skin Lotion  Nivea Body Creamy Conditioning Oil  Nivea Body Extra Enriched Teacher, adult education Moisturizing Lotion Nivea Crme  Nivea Skin Firming Lotion  NutraDerm 30 Skin Lotion  NutraDerm Skin Lotion  NutraDerm Therapeutic Skin Cream  NutraDerm Therapeutic Skin Lotion  ProShield Protective Hand Cream  Provon moisturizing lotion

## 2023-11-30 ENCOUNTER — Ambulatory Visit: Payer: Self-pay | Admitting: Emergency Medicine

## 2023-11-30 DIAGNOSIS — G8929 Other chronic pain: Secondary | ICD-10-CM

## 2023-11-30 NOTE — H&P (View-Only) (Signed)
 TOTAL HIP ADMISSION H&P  Patient is admitted for right total hip arthroplasty.  Subjective:  Chief Complaint: right hip pain  HPI: [name], 58 y.o. female, has a history of pain and functional disability in the right hip(s) due to arthritis and patient has failed non-surgical conservative treatments for greater than 12 weeks to include NSAID's and/or analgesics, corticosteriod injections, and activity modification.  Onset of symptoms was gradual starting 3 years ago with gradually worsening course since that time.The patient noted no past surgery on the right hip(s).  Patient currently rates pain in the right hip at 9 out of 10 with activity. Patient has night pain, worsening of pain with activity and weight bearing, pain that interfers with activities of daily living, and pain with passive range of motion. Patient has evidence of periarticular osteophytes and joint space narrowing by imaging studies. This condition presents safety issues increasing the risk of falls.  There is no current active infection.  There are no active problems to display for this patient.  Past Medical History:  Diagnosis Date   Autoimmune hepatitis ([facility])     Past Surgical History:  Procedure Laterality Date   ABDOMINAL HYSTERECTOMY     CHOLECYSTECTOMY     KNEE SURGERY Bilateral    nerve root compression      Current Outpatient Medications  Medication Sig Dispense Refill Last Dose/Taking   azaTHIOprine (IMURAN) 50 MG tablet Take 100 mg by mouth daily.      buPROPion (WELLBUTRIN XL) 300 MG 24 hr tablet Take 300 mg by mouth daily.      CALCIUM-MAGNESIUM-ZINC-VIT D3 PO Take 1 tablet by mouth at bedtime. (Patient not taking: Reported on 11/27/2023)      calcium-vitamin D (OSCAL WITH D) 500-5 MG-MCG tablet Take 1 tablet by mouth daily with breakfast. (Patient not taking: Reported on 11/27/2023)      estradiol (ESTRACE) 0.5 MG tablet Take 0.5 mg by mouth daily. (Patient not taking: Reported on 11/27/2023)       lisinopril (ZESTRIL) 20 MG tablet Take 20 mg by mouth daily.      meloxicam (MOBIC) 15 MG tablet Take 15 mg by mouth daily. (Patient not taking: Reported on 11/27/2023)      triamterene-hydrochlorothiazide (MAXZIDE-25) 37.5-25 MG tablet Take 1 tablet by mouth daily.      VYVANSE 50 MG capsule Take 50 mg by mouth every morning.      No current facility-administered medications for this visit.   Allergies  Allergen Reactions   Sulfa Antibiotics Hives    Social History   Tobacco Use   Smoking status: Never   Smokeless tobacco: Never  Substance Use Topics   Alcohol use: Not Currently    No family history on file.   Review of Systems  Musculoskeletal:  Positive for arthralgias.  All other systems reviewed and are negative.   Objective:  Physical Exam Constitutional:      General: She is not in acute distress.    Appearance: Normal appearance. She is not ill-appearing.  HENT:     Head: Normocephalic and atraumatic.     Right Ear: External ear normal.     Left Ear: External ear normal.     Nose: Nose normal.     Mouth/Throat:     Mouth: Mucous membranes are moist.     Pharynx: Oropharynx is clear.  Eyes:     Extraocular Movements: Extraocular movements intact.     Conjunctiva/sclera: Conjunctivae normal.  Cardiovascular:     Rate and  Rhythm: Normal rate and regular rhythm.     Pulses: Normal pulses.  Pulmonary:     Effort: Pulmonary effort is normal.  Abdominal:     Palpations: Abdomen is soft.  Musculoskeletal:        General: Tenderness present.     Cervical back: Normal range of motion and neck supple.  Skin:    General: Skin is warm and dry.  Neurological:     Mental Status: She is alert and oriented to person, place, and time. Mental status is at baseline.  Psychiatric:        Mood and Affect: Mood normal.        Behavior: Behavior normal.     Vital signs in last 24 hours: @VSRANGES @  Labs:   Estimated body mass index is 32.08 kg/m as calculated  from the following:   Height as of 04/18/20: 5' 8 (1.727 m).   Weight as of 04/18/20: 95.7 kg.   Imaging Review Plain radiographs demonstrate severe degenerative joint disease of the right hip(s). The bone quality appears to be fair for age and reported activity level.      Assessment/Plan:  End stage arthritis, right hip(s)  The patient history, physical examination, clinical judgement of the provider and imaging studies are consistent with end stage degenerative joint disease of the right hip(s) and total hip arthroplasty is deemed medically necessary. The treatment options including medical management, injection therapy, arthroscopy and arthroplasty were discussed at length. The risks and benefits of total hip arthroplasty were presented and reviewed. The risks due to aseptic loosening, infection, stiffness, dislocation/subluxation,  thromboembolic complications and other imponderables were discussed.  The patient acknowledged the explanation, agreed to proceed with the plan and consent was signed. Patient is being admitted for inpatient treatment for surgery, pain control, PT, OT, prophylactic antibiotics, VTE prophylaxis, progressive ambulation and ADL's and discharge planning.The patient is planning to be discharged OPPT   Anticipated LOS equal to or greater than 2 midnights due to - Age 65 and older with one or more of the following:  - Obesity  - Expected need for hospital services (PT, OT, Nursing) required for safe  discharge  - Anticipated need for postoperative skilled nursing care or inpatient rehab  - Active co-morbidities: HTN, HLD, CKD stage II, OSA, anxiety, depression, ADHD, graves disease, fatty liver disease, autoimmune thrombocytopenia, paroxysmal tachycardia, autoimmune hepatitis OR   - Unanticipated findings during/Post Surgery: None  - Patient is a high risk of re-admission due to: None

## 2023-11-30 NOTE — H&P (Addendum)
 TOTAL HIP ADMISSION H&P  Patient is admitted for right total hip arthroplasty.  Subjective:  Chief Complaint: right hip pain  HPI: [name], 58 y.o. female, has a history of pain and functional disability in the right hip(s) due to arthritis and patient has failed non-surgical conservative treatments for greater than 12 weeks to include NSAID's and/or analgesics, corticosteriod injections, and activity modification.  Onset of symptoms was gradual starting 3 years ago with gradually worsening course since that time.The patient noted no past surgery on the right hip(s).  Patient currently rates pain in the right hip at 9 out of 10 with activity. Patient has night pain, worsening of pain with activity and weight bearing, pain that interfers with activities of daily living, and pain with passive range of motion. Patient has evidence of periarticular osteophytes and joint space narrowing by imaging studies. This condition presents safety issues increasing the risk of falls.  There is no current active infection.  There are no active problems to display for this patient.  Past Medical History:  Diagnosis Date   Autoimmune hepatitis (HCC)     Past Surgical History:  Procedure Laterality Date   ABDOMINAL HYSTERECTOMY     CHOLECYSTECTOMY     KNEE SURGERY Bilateral    nerve root compression      Current Outpatient Medications  Medication Sig Dispense Refill Last Dose/Taking   azaTHIOprine (IMURAN) 50 MG tablet Take 100 mg by mouth daily.      buPROPion (WELLBUTRIN XL) 300 MG 24 hr tablet Take 300 mg by mouth daily.      CALCIUM-MAGNESIUM-ZINC-VIT D3 PO Take 1 tablet by mouth at bedtime. (Patient not taking: Reported on 11/27/2023)      calcium-vitamin D (OSCAL WITH D) 500-5 MG-MCG tablet Take 1 tablet by mouth daily with breakfast. (Patient not taking: Reported on 11/27/2023)      estradiol (ESTRACE) 0.5 MG tablet Take 0.5 mg by mouth daily. (Patient not taking: Reported on 11/27/2023)       lisinopril (ZESTRIL) 20 MG tablet Take 20 mg by mouth daily.      meloxicam (MOBIC) 15 MG tablet Take 15 mg by mouth daily. (Patient not taking: Reported on 11/27/2023)      triamterene-hydrochlorothiazide (MAXZIDE-25) 37.5-25 MG tablet Take 1 tablet by mouth daily.      VYVANSE 50 MG capsule Take 50 mg by mouth every morning.      No current facility-administered medications for this visit.   Allergies  Allergen Reactions   Sulfa Antibiotics Hives    Social History   Tobacco Use   Smoking status: Never   Smokeless tobacco: Never  Substance Use Topics   Alcohol use: Not Currently    No family history on file.   Review of Systems  Musculoskeletal:  Positive for arthralgias.  All other systems reviewed and are negative.   Objective:  Physical Exam Constitutional:      General: She is not in acute distress.    Appearance: Normal appearance. She is not ill-appearing.  HENT:     Head: Normocephalic and atraumatic.     Right Ear: External ear normal.     Left Ear: External ear normal.     Nose: Nose normal.     Mouth/Throat:     Mouth: Mucous membranes are moist.     Pharynx: Oropharynx is clear.  Eyes:     Extraocular Movements: Extraocular movements intact.     Conjunctiva/sclera: Conjunctivae normal.  Cardiovascular:     Rate and  Rhythm: Normal rate and regular rhythm.     Pulses: Normal pulses.  Pulmonary:     Effort: Pulmonary effort is normal.  Abdominal:     Palpations: Abdomen is soft.  Musculoskeletal:        General: Tenderness present.     Cervical back: Normal range of motion and neck supple.  Skin:    General: Skin is warm and dry.  Neurological:     Mental Status: She is alert and oriented to person, place, and time. Mental status is at baseline.  Psychiatric:        Mood and Affect: Mood normal.        Behavior: Behavior normal.     Vital signs in last 24 hours: @VSRANGES @  Labs:   Estimated body mass index is 32.08 kg/m as calculated  from the following:   Height as of 04/18/20: 5' 8 (1.727 m).   Weight as of 04/18/20: 95.7 kg.   Imaging Review Plain radiographs demonstrate severe degenerative joint disease of the right hip(s). The bone quality appears to be fair for age and reported activity level.      Assessment/Plan:  End stage arthritis, right hip(s)  The patient history, physical examination, clinical judgement of the provider and imaging studies are consistent with end stage degenerative joint disease of the right hip(s) and total hip arthroplasty is deemed medically necessary. The treatment options including medical management, injection therapy, arthroscopy and arthroplasty were discussed at length. The risks and benefits of total hip arthroplasty were presented and reviewed. The risks due to aseptic loosening, infection, stiffness, dislocation/subluxation,  thromboembolic complications and other imponderables were discussed.  The patient acknowledged the explanation, agreed to proceed with the plan and consent was signed. Patient is being admitted for inpatient treatment for surgery, pain control, PT, OT, prophylactic antibiotics, VTE prophylaxis, progressive ambulation and ADL's and discharge planning.The patient is planning to be discharged OPPT   Anticipated LOS equal to or greater than 2 midnights due to - Age 65 and older with one or more of the following:  - Obesity  - Expected need for hospital services (PT, OT, Nursing) required for safe  discharge  - Anticipated need for postoperative skilled nursing care or inpatient rehab  - Active co-morbidities: HTN, HLD, CKD stage II, OSA, anxiety, depression, ADHD, graves disease, fatty liver disease, autoimmune thrombocytopenia, paroxysmal tachycardia, autoimmune hepatitis OR   - Unanticipated findings during/Post Surgery: None  - Patient is a high risk of re-admission due to: None

## 2023-12-01 ENCOUNTER — Other Ambulatory Visit: Payer: Self-pay

## 2023-12-01 ENCOUNTER — Encounter (HOSPITAL_COMMUNITY): Payer: Self-pay

## 2023-12-01 ENCOUNTER — Encounter (HOSPITAL_COMMUNITY)
Admission: RE | Admit: 2023-12-01 | Discharge: 2023-12-01 | Disposition: A | Discharge status: Home / Self Care | Source: Ambulatory Visit | Attending: Orthopedic Surgery | Admitting: Orthopedic Surgery

## 2023-12-01 VITALS — BP 177/128 | HR 114 | Temp 98.5°F | Resp 16 | Ht 68.0 in

## 2023-12-01 DIAGNOSIS — M1611 Unilateral primary osteoarthritis, right hip: Secondary | ICD-10-CM | POA: Insufficient documentation

## 2023-12-01 DIAGNOSIS — G8929 Other chronic pain: Secondary | ICD-10-CM | POA: Diagnosis not present

## 2023-12-01 DIAGNOSIS — G4733 Obstructive sleep apnea (adult) (pediatric): Secondary | ICD-10-CM | POA: Diagnosis not present

## 2023-12-01 DIAGNOSIS — K754 Autoimmune hepatitis: Secondary | ICD-10-CM | POA: Insufficient documentation

## 2023-12-01 DIAGNOSIS — Z01818 Encounter for other preprocedural examination: Secondary | ICD-10-CM | POA: Insufficient documentation

## 2023-12-01 DIAGNOSIS — R000 Tachycardia, unspecified: Secondary | ICD-10-CM | POA: Insufficient documentation

## 2023-12-01 DIAGNOSIS — I10 Essential (primary) hypertension: Secondary | ICD-10-CM | POA: Insufficient documentation

## 2023-12-01 HISTORY — DX: Anxiety disorder, unspecified: F41.9

## 2023-12-01 HISTORY — DX: Depression, unspecified: F32.A

## 2023-12-01 HISTORY — DX: Essential (primary) hypertension: I10

## 2023-12-01 HISTORY — DX: Unspecified osteoarthritis, unspecified site: M19.90

## 2023-12-01 HISTORY — DX: Attention-deficit hyperactivity disorder, unspecified type: F90.9

## 2023-12-01 HISTORY — DX: Sleep apnea, unspecified: G47.30

## 2023-12-01 LAB — COMPREHENSIVE METABOLIC PANEL WITH GFR
ALT: 14 U/L (ref 0–44)
AST: 25 U/L (ref 15–41)
Albumin: 4.2 g/dL (ref 3.5–5.0)
Alkaline Phosphatase: 88 U/L (ref 38–126)
Anion gap: 12 (ref 5–15)
BUN: 12 mg/dL (ref 6–20)
CO2: 28 mmol/L (ref 22–32)
Calcium: 9.9 mg/dL (ref 8.9–10.3)
Chloride: 99 mmol/L (ref 98–111)
Creatinine, Ser: 1.23 mg/dL — ABNORMAL HIGH (ref 0.44–1.00)
GFR, Estimated: 50 mL/min — ABNORMAL LOW (ref >60)
Glucose, Bld: 96 mg/dL (ref 70–99)
Potassium: 3.6 mmol/L (ref 3.5–5.1)
Sodium: 139 mmol/L (ref 135–145)
Total Bilirubin: 0.4 mg/dL (ref 0.0–1.2)
Total Protein: 6.9 g/dL (ref 6.5–8.1)

## 2023-12-01 LAB — SURGICAL PCR SCREEN
MRSA, PCR: NEGATIVE
Staphylococcus aureus: NEGATIVE

## 2023-12-01 LAB — CBC WITH DIFFERENTIAL/PLATELET
Abs Immature Granulocytes: 0.03 K/uL (ref 0.00–0.07)
Basophils Absolute: 0 K/uL (ref 0.0–0.1)
Basophils Relative: 1 %
Eosinophils Absolute: 0 K/uL (ref 0.0–0.5)
Eosinophils Relative: 0 %
HCT: 45.6 % (ref 36.0–46.0)
Hemoglobin: 16.1 g/dL — ABNORMAL HIGH (ref 12.0–15.0)
Immature Granulocytes: 1 %
Lymphocytes Relative: 24 %
Lymphs Abs: 1.6 K/uL (ref 0.7–4.0)
MCH: 31.8 pg (ref 26.0–34.0)
MCHC: 35.3 g/dL (ref 30.0–36.0)
MCV: 89.9 fL (ref 80.0–100.0)
Monocytes Absolute: 0.4 K/uL (ref 0.1–1.0)
Monocytes Relative: 6 %
Neutro Abs: 4.4 K/uL (ref 1.7–7.7)
Neutrophils Relative %: 68 %
Platelets: DECREASED K/uL (ref 150–400)
RBC: 5.07 MIL/uL (ref 3.87–5.11)
RDW: 13 % (ref 11.5–15.5)
Smear Review: NORMAL
WBC: 6.4 K/uL (ref 4.0–10.5)
nRBC: 0 % (ref 0.0–0.2)

## 2023-12-02 ENCOUNTER — Encounter (HOSPITAL_COMMUNITY): Payer: Self-pay

## 2023-12-02 NOTE — Anesthesia Preprocedure Evaluation (Addendum)
 Anesthesia Evaluation  Patient identified by MRN, date of birth, ID band Patient awake    Reviewed: Allergy & Precautions, NPO status , Patient's Chart, lab work & pertinent test results  History of Anesthesia Complications Negative for: history of anesthetic complications  Airway Mallampati: II  TM Distance: >3 FB Neck ROM: Full    Dental  (+) Dental Advisory Given   Pulmonary sleep apnea (no longer uses CPAP)    breath sounds clear to auscultation       Cardiovascular hypertension, (-) angina  Rhythm:Regular Rate:Normal     Neuro/Psych  PSYCHIATRIC DISORDERS (ADHD) Anxiety Depression    negative neurological ROS     GI/Hepatic negative GI ROS, Neg liver ROS,,,  Endo/Other  BMI 37  Renal/GU Renal InsufficiencyRenal disease     Musculoskeletal   Abdominal   Peds  Hematology Hb 16.1   Anesthesia Other Findings   Reproductive/Obstetrics                              Anesthesia Physical Anesthesia Plan  ASA: 3  Anesthesia Plan: Spinal   Post-op Pain Management: Tylenol PO (pre-op)*   Induction:   PONV Risk Score and Plan: 0 and 1 and Ondansetron and Treatment may vary due to age or medical condition  Airway Management Planned: Natural Airway and Simple Face Mask  Additional Equipment: None  Intra-op Plan:   Post-operative Plan:   Informed Consent: I have reviewed the patients History and Physical, chart, labs and discussed the procedure including the risks, benefits and alternatives for the proposed anesthesia with the patient or authorized representative who has indicated his/her understanding and acceptance.     Dental advisory given  Plan Discussed with: CRNA and Surgeon  Anesthesia Plan Comments: (See PAT note from 10/21 )         Anesthesia Quick Evaluation

## 2023-12-02 NOTE — Progress Notes (Signed)
 Case: [id number] Date/Time: 12/09/23 0715   Procedure: ARTHROPLASTY, HIP, TOTAL, ANTERIOR APPROACH (Right: Hip)   Anesthesia type: Spinal   Pre-op diagnosis: OA RIGHT HIP   Location: [facility] ROOM 06 / [facility]   Surgeons: Edna Toribio LABOR, MD       DISCUSSION: [name] is a 58 yo female with PMH of HTN, OSA (has CPAP), chronic thrombocytopenia, autoimmune hepatitis, anxiety, depression.  Patient with hx of biopsy-proven autoimmune hepatitis. She follows with liver team at [facility] and was last seen on 07/02/23. She takes Azathioprine. Prior testing has not indicated cirrhosis. Labs and US  abdomen ordered.  Seen by PCP on 08/05/23. All issues stable.  Labs WNL except for platelets which could not be counted due to clumping which appears to be a recurrent problem. Will add CBC for DOS.  BP elevated at PAT visit. Pt asymptomatic. EKG obtained and showed sinus tachycardia and per patient she is always tachycardic. She was advised to f/u with PCP regarding BP and made aware of possible cancellation for uncontrolled BP on DOS.  VS: BP (!) 177/128   Pulse (!) 114   Temp 36.9 C (Oral)   Resp 16   Ht 5' 8 (1.727 m)   SpO2 96%   BMI 32.08 kg/m   PROVIDERS: Samie Frederick, PA-C   LABS: Labs reviewed: Repeat CBC DOS (all labs ordered are listed, but only abnormal results are displayed)  Labs Reviewed  CBC WITH DIFFERENTIAL/PLATELET - Abnormal; Notable for the following components:      Result Value   Hemoglobin 16.1 (*)    All other components within normal limits  COMPREHENSIVE METABOLIC PANEL WITH GFR - Abnormal; Notable for the following components:   Creatinine, Ser 1.23 (*)    GFR, Estimated 50 (*)    All other components within normal limits  SURGICAL PCR SCREEN  TYPE AND SCREEN     US  Abdomen 07/17/23:  IMPRESSION: Diffusely increased hepatic parenchymal echogenicity with some coarsening of the hepatic echotexture and decreased acoustic penetration. Findings are  nonspecific but can be seen in the setting of hepatic steatosis and/or hepatocellular disease. No discrete contour nodularity to suggest cirrhosis.  EKG 12/01/23:  Sinus tachycardia, rate 112 Possible Anterior infarct , age undetermined Abnormal ECG When compared with ECG of 27-Dec-2007 13:53, PREVIOUS ECG IS PRESENT Since last tracing rate faster  CV:  Past Medical History:  Diagnosis Date   ADHD (attention deficit hyperactivity disorder)    Anxiety    Arthritis    Autoimmune hepatitis (HCC)    Depression    Hypertension    Sleep apnea    does not use cpap very often per pt    Past Surgical History:  Procedure Laterality Date   ABDOMINAL HYSTERECTOMY     benign papilloma removed from right breast      CHOLECYSTECTOMY     KNEE SURGERY Bilateral    nerve root compression      MEDICATIONS:  azaTHIOprine (IMURAN) 50 MG tablet   buPROPion (WELLBUTRIN XL) 300 MG 24 hr tablet   CALCIUM-MAGNESIUM-ZINC-VIT D3 PO   calcium-vitamin D (OSCAL WITH D) 500-5 MG-MCG tablet   estradiol (ESTRACE) 0.5 MG tablet   lisinopril (ZESTRIL) 20 MG tablet   meloxicam (MOBIC) 15 MG tablet   triamterene-hydrochlorothiazide (MAXZIDE-25) 37.5-25 MG tablet   VYVANSE 50 MG capsule   No current facility-administered medications for this encounter.   Burnard CHRISTELLA Odis DEVONNA [facility] Short Stay/Anesthesiology [facility] Phone [phone] 12/02/2023 2:09 PM

## 2023-12-09 ENCOUNTER — Ambulatory Visit (HOSPITAL_COMMUNITY): Payer: Self-pay | Admitting: Medical

## 2023-12-09 ENCOUNTER — Ambulatory Visit (HOSPITAL_COMMUNITY)

## 2023-12-09 ENCOUNTER — Ambulatory Visit (HOSPITAL_COMMUNITY)
Admission: RE | Admit: 2023-12-09 | Discharge: 2023-12-09 | Disposition: A | Discharge status: Home / Self Care | Attending: Orthopedic Surgery | Admitting: Orthopedic Surgery

## 2023-12-09 ENCOUNTER — Encounter (HOSPITAL_COMMUNITY): Admission: RE | Disposition: A | Payer: Self-pay | Source: Home / Self Care | Attending: Orthopedic Surgery

## 2023-12-09 ENCOUNTER — Encounter (HOSPITAL_COMMUNITY): Payer: Self-pay | Admitting: Orthopedic Surgery

## 2023-12-09 ENCOUNTER — Ambulatory Visit (HOSPITAL_COMMUNITY): Payer: Self-pay | Admitting: Physician Assistant

## 2023-12-09 ENCOUNTER — Other Ambulatory Visit: Payer: Self-pay

## 2023-12-09 DIAGNOSIS — D693 Immune thrombocytopenic purpura: Secondary | ICD-10-CM | POA: Insufficient documentation

## 2023-12-09 DIAGNOSIS — E785 Hyperlipidemia, unspecified: Secondary | ICD-10-CM | POA: Insufficient documentation

## 2023-12-09 DIAGNOSIS — Z01818 Encounter for other preprocedural examination: Secondary | ICD-10-CM

## 2023-12-09 DIAGNOSIS — M1611 Unilateral primary osteoarthritis, right hip: Secondary | ICD-10-CM | POA: Insufficient documentation

## 2023-12-09 DIAGNOSIS — Z7182 Exercise counseling: Secondary | ICD-10-CM | POA: Insufficient documentation

## 2023-12-09 DIAGNOSIS — N182 Chronic kidney disease, stage 2 (mild): Secondary | ICD-10-CM | POA: Diagnosis not present

## 2023-12-09 DIAGNOSIS — I479 Paroxysmal tachycardia, unspecified: Secondary | ICD-10-CM | POA: Insufficient documentation

## 2023-12-09 DIAGNOSIS — F418 Other specified anxiety disorders: Secondary | ICD-10-CM | POA: Diagnosis not present

## 2023-12-09 DIAGNOSIS — I10 Essential (primary) hypertension: Secondary | ICD-10-CM

## 2023-12-09 DIAGNOSIS — D696 Thrombocytopenia, unspecified: Secondary | ICD-10-CM

## 2023-12-09 DIAGNOSIS — E669 Obesity, unspecified: Secondary | ICD-10-CM | POA: Insufficient documentation

## 2023-12-09 DIAGNOSIS — F909 Attention-deficit hyperactivity disorder, unspecified type: Secondary | ICD-10-CM | POA: Diagnosis not present

## 2023-12-09 DIAGNOSIS — E0500 Thyrotoxicosis with diffuse goiter without thyrotoxic crisis or storm: Secondary | ICD-10-CM | POA: Insufficient documentation

## 2023-12-09 DIAGNOSIS — I129 Hypertensive chronic kidney disease with stage 1 through stage 4 chronic kidney disease, or unspecified chronic kidney disease: Secondary | ICD-10-CM | POA: Diagnosis not present

## 2023-12-09 DIAGNOSIS — Z6836 Body mass index (BMI) 36.0-36.9, adult: Secondary | ICD-10-CM | POA: Diagnosis not present

## 2023-12-09 DIAGNOSIS — G4733 Obstructive sleep apnea (adult) (pediatric): Secondary | ICD-10-CM | POA: Insufficient documentation

## 2023-12-09 HISTORY — PX: TOTAL HIP ARTHROPLASTY: SHX124

## 2023-12-09 LAB — CBC
HCT: 41.7 % (ref 36.0–46.0)
Hemoglobin: 14.2 g/dL (ref 12.0–15.0)
MCH: 30.9 pg (ref 26.0–34.0)
MCHC: 34.1 g/dL (ref 30.0–36.0)
MCV: 90.8 fL (ref 80.0–100.0)
Platelets: 162 K/uL (ref 150–400)
RBC: 4.59 MIL/uL (ref 3.87–5.11)
RDW: 13.1 % (ref 11.5–15.5)
WBC: 4.9 K/uL (ref 4.0–10.5)
nRBC: 0 % (ref 0.0–0.2)

## 2023-12-09 LAB — TYPE AND SCREEN
ABO/RH(D): B NEG
Antibody Screen: NEGATIVE

## 2023-12-09 LAB — ABO/RH: ABO/RH(D): B NEG

## 2023-12-09 SURGERY — ARTHROPLASTY, HIP, TOTAL,POSTERIOR APPROACH
Anesthesia: Spinal | Site: Hip | Laterality: Right

## 2023-12-09 MED ORDER — POLYETHYLENE GLYCOL 3350 17 G PO PACK: 17.0000 g | PACK | Freq: Every day | ORAL | 0 refills | Status: AC

## 2023-12-09 MED ORDER — OXYCODONE HCL 5 MG PO TABS: 5.0000 mg | ORAL_TABLET | ORAL | Status: DC | PRN

## 2023-12-09 MED ORDER — BUPIVACAINE-EPINEPHRINE (PF) 0.25% -1:200000 IJ SOLN
INTRAMUSCULAR | Status: AC
  Filled 2023-12-09: qty 30

## 2023-12-09 MED ORDER — ASPIRIN 81 MG PO TBEC: 81.0000 mg | DELAYED_RELEASE_TABLET | Freq: Two times a day (BID) | ORAL | Status: AC

## 2023-12-09 MED ORDER — OMEPRAZOLE 40 MG PO CPDR: 40.0000 mg | DELAYED_RELEASE_CAPSULE | Freq: Every day | ORAL | 0 refills | Status: AC

## 2023-12-09 MED ORDER — SODIUM CHLORIDE 0.9 % IV SOLN
INTRAVENOUS | Status: DC | PRN
  Administered 2023-12-09: 80 mL

## 2023-12-09 MED ORDER — ISOPROPYL ALCOHOL 70 % SOLN
Status: AC
  Filled 2023-12-09: qty 480

## 2023-12-09 MED ORDER — HYDROMORPHONE HCL 1 MG/ML IJ SOLN
INTRAMUSCULAR | Status: AC
  Filled 2023-12-09: qty 1

## 2023-12-09 MED ORDER — CEFAZOLIN SODIUM-DEXTROSE 2-4 GM/100ML-% IV SOLN
2.0000 g | INTRAVENOUS | Status: AC
  Administered 2023-12-09: 2 g via INTRAVENOUS
  Filled 2023-12-09: qty 100

## 2023-12-09 MED ORDER — OXYCODONE HCL 5 MG PO TABS
5.0000 mg | ORAL_TABLET | ORAL | 0 refills | Status: AC | PRN
Start: 2023-12-09 — End: 2023-12-16

## 2023-12-09 MED ORDER — METHOCARBAMOL 500 MG PO TABS
500.0000 mg | ORAL_TABLET | Freq: Four times a day (QID) | ORAL | Status: DC | PRN
  Administered 2023-12-09: 500 mg via ORAL

## 2023-12-09 MED ORDER — ACETAMINOPHEN 500 MG PO TABS
1000.0000 mg | ORAL_TABLET | Freq: Four times a day (QID) | ORAL | Status: DC
  Administered 2023-12-09: 1000 mg via ORAL

## 2023-12-09 MED ORDER — OXYCODONE HCL 5 MG/5ML PO SOLN: 5.0000 mg | Freq: Once | ORAL | Status: AC | PRN

## 2023-12-09 MED ORDER — METHOCARBAMOL 500 MG PO TABS
ORAL_TABLET | ORAL | Status: AC
  Filled 2023-12-09: qty 1

## 2023-12-09 MED ORDER — MIDAZOLAM HCL 5 MG/5ML IJ SOLN
INTRAMUSCULAR | Status: DC | PRN
  Administered 2023-12-09: 2 mg via INTRAVENOUS

## 2023-12-09 MED ORDER — TRANEXAMIC ACID-NACL 1000-0.7 MG/100ML-% IV SOLN
1000.0000 mg | INTRAVENOUS | Status: AC
  Administered 2023-12-09: 1000 mg via INTRAVENOUS
  Filled 2023-12-09: qty 100

## 2023-12-09 MED ORDER — HYDROMORPHONE HCL 1 MG/ML IJ SOLN
0.2500 mg | INTRAMUSCULAR | Status: DC | PRN
  Administered 2023-12-09: 0.5 mg via INTRAVENOUS

## 2023-12-09 MED ORDER — METHOCARBAMOL 500 MG PO TABS: 500.0000 mg | ORAL_TABLET | Freq: Three times a day (TID) | ORAL | 0 refills | Status: AC | PRN

## 2023-12-09 MED ORDER — ONDANSETRON HCL 4 MG/2ML IJ SOLN
INTRAMUSCULAR | Status: DC | PRN
Start: 2023-12-09 — End: 2023-12-09
  Administered 2023-12-09: 4 mg via INTRAVENOUS

## 2023-12-09 MED ORDER — ACETAMINOPHEN 500 MG PO TABS
1000.0000 mg | ORAL_TABLET | Freq: Once | ORAL | Status: AC
  Administered 2023-12-09: 1000 mg via ORAL
  Filled 2023-12-09: qty 2

## 2023-12-09 MED ORDER — CEFAZOLIN SODIUM-DEXTROSE 2-4 GM/100ML-% IV SOLN: 2.0000 g | Freq: Four times a day (QID) | INTRAVENOUS | Status: DC

## 2023-12-09 MED ORDER — ONDANSETRON HCL 4 MG/2ML IJ SOLN: 4.0000 mg | Freq: Four times a day (QID) | INTRAMUSCULAR | Status: DC | PRN

## 2023-12-09 MED ORDER — SODIUM CHLORIDE 0.9 % IV SOLN: INTRAVENOUS | Status: DC

## 2023-12-09 MED ORDER — ACETAMINOPHEN 500 MG PO TABS: 1000.0000 mg | ORAL_TABLET | Freq: Three times a day (TID) | ORAL | Status: AC | PRN

## 2023-12-09 MED ORDER — CHLORHEXIDINE GLUCONATE 0.12 % MT SOLN
15.0000 mL | Freq: Once | OROMUCOSAL | Status: AC
  Administered 2023-12-09: 15 mL via OROMUCOSAL

## 2023-12-09 MED ORDER — BUPIVACAINE LIPOSOME 1.3 % IJ SUSP
INTRAMUSCULAR | Status: AC
  Filled 2023-12-09: qty 20

## 2023-12-09 MED ORDER — OXYCODONE HCL 5 MG PO TABS
5.0000 mg | ORAL_TABLET | Freq: Once | ORAL | Status: AC | PRN
  Administered 2023-12-09: 5 mg via ORAL

## 2023-12-09 MED ORDER — ONDANSETRON HCL 4 MG/2ML IJ SOLN
INTRAMUSCULAR | Status: AC
  Filled 2023-12-09: qty 2

## 2023-12-09 MED ORDER — ONDANSETRON HCL 4 MG PO TABS: 4.0000 mg | ORAL_TABLET | Freq: Four times a day (QID) | ORAL | Status: DC | PRN

## 2023-12-09 MED ORDER — OXYCODONE HCL 5 MG PO TABS
ORAL_TABLET | ORAL | Status: AC
  Filled 2023-12-09: qty 1

## 2023-12-09 MED ORDER — FENTANYL CITRATE (PF) 100 MCG/2ML IJ SOLN
INTRAMUSCULAR | Status: AC
  Filled 2023-12-09: qty 2

## 2023-12-09 MED ORDER — LACTATED RINGERS IV SOLN
INTRAVENOUS | Status: DC
Start: 2023-12-09 — End: 2023-12-09

## 2023-12-09 MED ORDER — PROPOFOL 500 MG/50ML IV EMUL
INTRAVENOUS | Status: DC | PRN
  Administered 2023-12-09: 50 ug/kg/min via INTRAVENOUS
  Administered 2023-12-09 (×2): 40 mg via INTRAVENOUS

## 2023-12-09 MED ORDER — HYDROMORPHONE HCL 1 MG/ML IJ SOLN: 0.5000 mg | INTRAMUSCULAR | Status: DC | PRN

## 2023-12-09 MED ORDER — DEXAMETHASONE SOD PHOSPHATE PF 10 MG/ML IJ SOLN
8.0000 mg | Freq: Once | INTRAMUSCULAR | Status: AC
Start: 2023-12-09 — End: 2023-12-09
  Administered 2023-12-09: 8 mg via INTRAVENOUS

## 2023-12-09 MED ORDER — 0.9 % SODIUM CHLORIDE (POUR BTL) OPTIME
TOPICAL | Status: DC | PRN
  Administered 2023-12-09: 1000 mL

## 2023-12-09 MED ORDER — LACTATED RINGERS IV BOLUS
500.0000 mL | Freq: Once | INTRAVENOUS | Status: AC
  Administered 2023-12-09: 500 mL via INTRAVENOUS

## 2023-12-09 MED ORDER — BUPIVACAINE IN DEXTROSE 0.75-8.25 % IT SOLN
INTRATHECAL | Status: DC | PRN
  Administered 2023-12-09: 13.5 mg via INTRATHECAL

## 2023-12-09 MED ORDER — LACTATED RINGERS IV BOLUS
250.0000 mL | Freq: Once | INTRAVENOUS | Status: AC
  Administered 2023-12-09: 250 mL via INTRAVENOUS

## 2023-12-09 MED ORDER — POVIDONE-IODINE 10 % EX SWAB: 2.0000 | Freq: Once | CUTANEOUS | Status: DC

## 2023-12-09 MED ORDER — MIDAZOLAM HCL (PF) 2 MG/2ML IJ SOLN: 0.5000 mg | Freq: Once | INTRAMUSCULAR | Status: DC | PRN

## 2023-12-09 MED ORDER — SODIUM CHLORIDE (PF) 0.9 % IJ SOLN
INTRAMUSCULAR | Status: AC
  Filled 2023-12-09: qty 50

## 2023-12-09 MED ORDER — ONDANSETRON HCL 4 MG PO TABS: 4.0000 mg | ORAL_TABLET | Freq: Three times a day (TID) | ORAL | 0 refills | Status: AC | PRN

## 2023-12-09 MED ORDER — MELOXICAM 15 MG PO TABS: 15.0000 mg | ORAL_TABLET | Freq: Every day | ORAL | 0 refills | Status: AC

## 2023-12-09 MED ORDER — SODIUM CHLORIDE 0.9 % IR SOLN: Status: DC | PRN

## 2023-12-09 MED ORDER — KETOROLAC TROMETHAMINE 15 MG/ML IJ SOLN
INTRAMUSCULAR | Status: AC
  Filled 2023-12-09: qty 1

## 2023-12-09 MED ORDER — MIDAZOLAM HCL 2 MG/2ML IJ SOLN
INTRAMUSCULAR | Status: AC
  Filled 2023-12-09: qty 2

## 2023-12-09 MED ORDER — SODIUM CHLORIDE 0.9 % IR SOLN
Status: DC | PRN
  Administered 2023-12-09: 1000 mL

## 2023-12-09 MED ORDER — ORAL CARE MOUTH RINSE: 15.0000 mL | Freq: Once | OROMUCOSAL | Status: AC

## 2023-12-09 MED ORDER — BUPIVACAINE LIPOSOME 1.3 % IJ SUSP: 10.0000 mL | Freq: Once | INTRAMUSCULAR | Status: DC

## 2023-12-09 MED ORDER — ACETAMINOPHEN 500 MG PO TABS
ORAL_TABLET | ORAL | Status: AC
  Filled 2023-12-09: qty 2

## 2023-12-09 MED ORDER — METHOCARBAMOL 1000 MG/10ML IJ SOLN: 500.0000 mg | Freq: Four times a day (QID) | INTRAMUSCULAR | Status: DC | PRN

## 2023-12-09 MED ORDER — PROPOFOL 1000 MG/100ML IV EMUL
INTRAVENOUS | Status: AC
  Filled 2023-12-09: qty 100

## 2023-12-09 MED ORDER — FENTANYL CITRATE (PF) 100 MCG/2ML IJ SOLN
INTRAMUSCULAR | Status: DC | PRN
  Administered 2023-12-09 (×4): 25 ug via INTRAVENOUS

## 2023-12-09 MED ORDER — KETOROLAC TROMETHAMINE 15 MG/ML IJ SOLN
7.5000 mg | Freq: Four times a day (QID) | INTRAMUSCULAR | Status: DC
  Administered 2023-12-09: 7.5 mg via INTRAVENOUS

## 2023-12-09 SURGICAL SUPPLY — 68 items
BAG COUNTER SPONGE SURGICOUNT (BAG) IMPLANT
BAG ZIPLOCK 12X15 (MISCELLANEOUS) ×2 IMPLANT
BIT DRILL TRIDENT 4X40 SU (BIT) IMPLANT
BLADE SAW SAG 25X90X1.19 (BLADE) ×2 IMPLANT
BRUSH FEMORAL CANAL (MISCELLANEOUS) IMPLANT
CHLORAPREP W/TINT 26 (MISCELLANEOUS) ×4 IMPLANT
CNTNR URN SCR LID CUP LEK RST (MISCELLANEOUS) ×2 IMPLANT
COVER SURGICAL LIGHT HANDLE (MISCELLANEOUS) ×2 IMPLANT
DERMABOND ADVANCED .7 DNX12 (GAUZE/BANDAGES/DRESSINGS) ×4 IMPLANT
DRAPE HIP W/POCKET STRL (MISCELLANEOUS) ×2 IMPLANT
DRAPE INCISE IOBAN 85X60 (DRAPES) ×2 IMPLANT
DRAPE POUCH INSTRU U-SHP 10X18 (DRAPES) ×2 IMPLANT
DRAPE SHEET LG 3/4 BI-LAMINATE (DRAPES) ×6 IMPLANT
DRAPE U-SHAPE 47X51 STRL (DRAPES) ×4 IMPLANT
DRSG AQUACEL AG ADV 3.5X10 (GAUZE/BANDAGES/DRESSINGS) ×2 IMPLANT
DRSG AQUACEL AG ADV 3.5X14 (GAUZE/BANDAGES/DRESSINGS) IMPLANT
ELECT BLADE TIP CTD 4 INCH (ELECTRODE) ×2 IMPLANT
ELECT REM PT RETURN 15FT ADLT (MISCELLANEOUS) ×2 IMPLANT
FACESHIELD WRAPAROUND OR TEAM (MASK) ×2 IMPLANT
GAUZE SPONGE 4X4 12PLY STRL (GAUZE/BANDAGES/DRESSINGS) ×2 IMPLANT
GLOVE BIO SURGEON STRL SZ 6.5 (GLOVE) ×4 IMPLANT
GLOVE BIOGEL PI IND STRL 6.5 (GLOVE) ×2 IMPLANT
GLOVE BIOGEL PI IND STRL 8 (GLOVE) ×2 IMPLANT
GLOVE SURG ORTHO 8.0 STRL STRW (GLOVE) ×4 IMPLANT
GOWN STRL REUS W/ TWL XL LVL3 (GOWN DISPOSABLE) ×4 IMPLANT
HEAD BIOLOX HIP 36/-5 (Joint) IMPLANT
HOLDER FOLEY CATH W/STRAP (MISCELLANEOUS) ×2 IMPLANT
HOOD PEEL AWAY T7 (MISCELLANEOUS) ×6 IMPLANT
INSERT 0 DEG POLY 36 F (Miscellaneous) IMPLANT
KIT BASIN OR (CUSTOM PROCEDURE TRAY) ×2 IMPLANT
KIT TURNOVER KIT A (KITS) ×2 IMPLANT
MANIFOLD NEPTUNE II (INSTRUMENTS) ×2 IMPLANT
MARKER SKIN DUAL TIP RULER LAB (MISCELLANEOUS) ×2 IMPLANT
NDL SAFETY ECLIPSE 18X1.5 (NEEDLE) ×4 IMPLANT
NS IRRIG 1000ML POUR BTL (IV SOLUTION) ×2 IMPLANT
PACK TOTAL JOINT (CUSTOM PROCEDURE TRAY) ×2 IMPLANT
PAD ARMBOARD POSITIONER FOAM (MISCELLANEOUS) ×2 IMPLANT
PENCIL SMOKE EVACUATOR (MISCELLANEOUS) ×2 IMPLANT
PRESSURIZER FEMORAL UNIV (MISCELLANEOUS) IMPLANT
PROTECTOR NERVE ULNAR (MISCELLANEOUS) IMPLANT
RETRIEVER SUT HEWSON (MISCELLANEOUS) ×2 IMPLANT
SCREW HEX LP 6.5X25 (Screw) IMPLANT
SCREW HEX LP 6.5X30 (Screw) IMPLANT
SEALER BIPOLAR AQUA 6.0 (INSTRUMENTS) IMPLANT
SET HNDPC FAN SPRY TIP SCT (DISPOSABLE) IMPLANT
SHELL TRIDENT II CLUST SZ 56MM (Shell) IMPLANT
SOLUTION IRRIG SURGIPHOR (IV SOLUTION) IMPLANT
SOLUTION PRONTOSAN WOUND 350ML (IRRIGATION / IRRIGATOR) IMPLANT
SPIKE FLUID TRANSFER (MISCELLANEOUS) ×2 IMPLANT
STEM 37MM HIP (Hips) IMPLANT
SUCTION TUBE FRAZIER 12FR DISP (SUCTIONS) ×2 IMPLANT
SUT BONE WAX W31G (SUTURE) ×2 IMPLANT
SUT ETHIBOND #5 BRAIDED 30INL (SUTURE) ×2 IMPLANT
SUT MNCRL AB 3-0 PS2 18 (SUTURE) ×2 IMPLANT
SUT STRATAFIX 14 PDO 48 VLT (SUTURE) ×2 IMPLANT
SUT VIC AB 0 CT1 36 (SUTURE) IMPLANT
SUT VIC AB 2-0 CT2 27 (SUTURE) ×4 IMPLANT
SUTURE STRATFX 0 PDS 27 VIOLET (SUTURE) ×2 IMPLANT
SYR 30ML LL (SYRINGE) ×2 IMPLANT
SYR 50ML LL SCALE MARK (SYRINGE) ×2 IMPLANT
TOWEL GREEN STERILE FF (TOWEL DISPOSABLE) ×2 IMPLANT
TOWEL OR 17X26 10 PK STRL BLUE (TOWEL DISPOSABLE) ×2 IMPLANT
TOWER CARTRIDGE SMART MIX (DISPOSABLE) IMPLANT
TRAY FOLEY MTR SLVR 14FR STAT (SET/KITS/TRAYS/PACK) IMPLANT
TRAY FOLEY MTR SLVR 16FR STAT (SET/KITS/TRAYS/PACK) IMPLANT
TUBE SUCTION HIGH CAP CLEAR NV (SUCTIONS) ×2 IMPLANT
UNDERPAD 30X36 HEAVY ABSORB (UNDERPADS AND DIAPERS) ×2 IMPLANT
WATER STERILE IRR 1000ML POUR (IV SOLUTION) ×4 IMPLANT

## 2023-12-09 NOTE — Discharge Instructions (Signed)

## 2023-12-09 NOTE — Anesthesia Postprocedure Evaluation (Signed)
 Anesthesia Post Note  Patient: Ronal Czar Luczak  Procedure(s) Performed: ARTHROPLASTY, HIP, TOTAL,POSTERIOR APPROACH (Right: Hip)     Patient location during evaluation: Phase II Anesthesia Type: Spinal Level of consciousness: oriented, patient cooperative and awake and alert Pain management: pain level controlled Vital Signs Assessment: post-procedure vital signs reviewed and stable Respiratory status: spontaneous breathing, nonlabored ventilation and respiratory function stable Cardiovascular status: blood pressure returned to baseline and stable Postop Assessment: no apparent nausea or vomiting, spinal receding, able to ambulate, patient able to bend at knees, no headache and adequate PO intake Anesthetic complications: no   No notable events documented.  Last Vitals:  Vitals:   12/09/23 1115 12/09/23 1130  BP: (!) 107/57 (!) 91/51  Pulse: 81   Resp: 16   Temp: (!) 36.1 C   SpO2: 96%     Last Pain:  Vitals:   12/09/23 1212  TempSrc:   PainSc: 5                  Riyah Bardon,E. Margreat Widener

## 2023-12-09 NOTE — Transfer of Care (Signed)
 Immediate Anesthesia Transfer of Care Note  Patient: [name]  Procedure(s) Performed: ARTHROPLASTY, HIP, TOTAL,POSTERIOR APPROACH (Right: Hip)  Patient Location: PACU  Anesthesia Type:Spinal  Level of Consciousness: awake  Airway & Oxygen Therapy: Patient Spontanous Breathing and Patient connected to face mask oxygen  Post-op Assessment: Report given to RN and Post -op Vital signs reviewed and stable  Post vital signs: Reviewed and stable  Last Vitals:  Vitals Value Taken Time  BP 112/66 12/09/23 09:54  Temp    Pulse 81 12/09/23 09:57  Resp 24 12/09/23 09:57  SpO2 100 % 12/09/23 09:57  Vitals shown include unfiled device data.  Last Pain:  Vitals:   12/09/23 0626  TempSrc: Oral  PainSc:          Complications: No notable events documented.

## 2023-12-09 NOTE — Evaluation (Signed)
 Physical Therapy Evaluation Patient Details Name: [name] MRN: [id number] DOB: Jul 07, 1965 Today's Date: 12/09/2023  History of Present Illness  58 yo female presents to therapy s/p R THA, posterior approach on 12/09/2023 due to failure of conservative measures. Pt is currently R LE WBAT and no formal hip precautions. Pt PMH includes but is not limited to: autoimmune hepatitis, cholecystectomy, B TKA, HTN, OSA, GAD, ADHD, and arthritis.  Clinical Impression    [name] is a 58 y.o. female POD 0 s/p R THA. Patient reports IND with mobility at baseline. Patient is now limited by functional impairments (see PT problem list below) and requires CGA/S for transfers and gait with RW. Patient was able to ambulate 55 feet x 2 with RW and CGA/close S and cues for safe walker management. Patient educated on safe sequencing for stair mobility, fall risk prevention, pain management, use of CP/ice, no formal hip precautions, and car transfers pt and spouse verbalized understanding of safe guarding position for people assisting with mobility. Patient instructed in exercises to facilitate ROM and circulation reviewed and HO provided. Patient will benefit from continued skilled PT interventions to address impairments and progress towards PLOF. Patient has met mobility goals at adequate level for discharge home with family support and OPPT services; will continue to follow if pt continues acute stay to progress towards Mod I goals.       If plan is discharge home, recommend the following: A little help with walking and/or transfers;A little help with bathing/dressing/bathroom;Assistance with cooking/housework;Assist for transportation;Help with stairs or ramp for entrance   Can travel by private vehicle        Equipment Recommendations None recommended by PT  Recommendations for Other Services       Functional Status Assessment Patient has had a recent decline in their functional status  and demonstrates the ability to make significant improvements in function in a reasonable and predictable amount of time.     Precautions / Restrictions Precautions Precautions: Fall Restrictions Weight Bearing Restrictions Per Provider Order: No      Mobility  Bed Mobility Overal bed mobility: Needs Assistance Bed Mobility: Supine to Sit     Supine to sit: Supervision     General bed mobility comments: min cues and HOB slightly elevated    Transfers Overall transfer level: Needs assistance Equipment used: Rolling walker (2 wheels) Transfers: Sit to/from Stand Sit to Stand: Contact guard assist, Supervision           General transfer comment: min cues for safety UE and AD placement from a variety of surfaces    Ambulation/Gait Ambulation/Gait assistance: Contact guard assist Gait Distance (Feet): 55 Feet Assistive device: Rolling walker (2 wheels) Gait Pattern/deviations: Step-to pattern, Decreased stance time - right, Antalgic, Trunk flexed Gait velocity: decreased     General Gait Details: slight trunk flexion with B UE support at RW, step to pattern and min cues for RW management  Stairs Stairs: Yes Stairs assistance: Contact guard assist Stair Management: Two rails Number of Stairs: 3 General stair comments: min cues for safety, sequencing and step to [employer]  Wheelchair Mobility     Tilt Bed    Modified Rankin (Stroke Patients Only)       Balance Overall balance assessment: Needs assistance Sitting-balance support: Feet supported Sitting balance-Leahy Scale: Good     Standing balance support: Bilateral upper extremity supported, During functional activity, Reliant on assistive device for balance Standing balance-Leahy Scale: Fair Standing balance comment: static standing  no UE support                             Pertinent Vitals/Pain Pain Assessment Pain Assessment: 0-10 Pain Score: 2  Pain Location: R hip and LE Pain  Descriptors / Indicators: Aching, Heaviness Pain Intervention(s): Limited activity within patient's tolerance, Monitored during session, Premedicated before session, Ice applied, Repositioned    Home Living Family/patient expects to be discharged to:: Private residence Living Arrangements: Spouse/significant other Available Help at Discharge: Family Type of Home: House Home Access: Stairs to enter Entrance Stairs-Rails: Right;Left;Can reach both Entrance Stairs-Number of Steps: 4   Home Layout: One level Home Equipment: [employer] (2 wheels);Toilet riser      Prior Function Prior Level of Function : Independent/Modified Independent;Working/employed;Driving             Mobility Comments: IND with all ADLs, self care tasks and IADLs       Extremity/Trunk Assessment        Lower Extremity Assessment Lower Extremity Assessment: RLE deficits/detail RLE Deficits / Details: ankle DF/PF 5/5 RLE Sensation: decreased light touch (L > R)    Cervical / Trunk Assessment Cervical / Trunk Assessment: Normal  Communication   Communication Communication: No apparent difficulties    Cognition Arousal: Alert Behavior During Therapy: WFL for tasks assessed/performed   PT - Cognitive impairments: No apparent impairments                         Following commands: Intact       Cueing       General Comments      Exercises Total Joint Exercises Ankle Circles/Pumps: AROM, Both, 10 reps Quad Sets: AROM, Right, 5 reps Heel Slides: AROM, Right, 5 reps Hip ABduction/ADduction: AROM, Right, 5 reps, Standing Long Arc Quad: AROM, Right, 5 reps, Seated Knee Flexion: AROM, Right, 5 reps, Standing Standing Hip Extension: AROM, Right, 5 reps, Standing   Assessment/Plan    PT Assessment Patient needs continued PT services  PT Problem List Decreased strength;Decreased range of motion;Decreased activity tolerance;Decreased balance;Decreased mobility;Decreased  coordination;Pain       PT Treatment Interventions DME instruction;Gait training;Stair training;Functional mobility training;Therapeutic activities;Therapeutic exercise;Balance training;Neuromuscular re-education;Patient/family education;Modalities    PT Goals (Current goals can be found in the Care Plan section)  Acute Rehab PT Goals Patient Stated Goal: to be the strongest I can and feel confident in my body, walk no pain PT Goal Formulation: With patient Time For Goal Achievement: 12/23/23 Potential to Achieve Goals: Good    Frequency 7X/week     Co-evaluation               AM-PAC PT 6 Clicks Mobility  Outcome Measure Help needed turning from your back to your side while in a flat bed without using bedrails?: None Help needed moving from lying on your back to sitting on the side of a flat bed without using bedrails?: A Little Help needed moving to and from a bed to a chair (including a wheelchair)?: A Little Help needed standing up from a chair using your arms (e.g., wheelchair or bedside chair)?: A Little Help needed to walk in hospital room?: A Little Help needed climbing 3-5 steps with a railing? : A Little 6 Click Score: 19    End of Session Equipment Utilized During Treatment: Gait belt Activity Tolerance: Patient tolerated treatment well;No increased pain Patient left: in chair;with call bell/phone within reach;with  family/visitor present Nurse Communication: Mobility status;Other (comment) (pt readiness for same day d/c from PT standpoint) PT Visit Diagnosis: Unsteadiness on feet (R26.81);Other abnormalities of gait and mobility (R26.89);Muscle weakness (generalized) (M62.81);Difficulty in walking, not elsewhere classified (R26.2);Pain Pain - Right/Left: Right Pain - part of body: Leg;Hip    Time: 8672-8564 PT Time Calculation (min) (ACUTE ONLY): 68 min   Charges:   PT Evaluation $PT Eval Low Complexity: 1 Low PT Treatments $Gait Training: 8-22  mins $Therapeutic Exercise: 8-22 mins $Therapeutic Activity: 23-37 mins PT General Charges $$ ACUTE PT VISIT: 1 Visit         [place], PT Acute Rehab   [place] VEAR Drone 12/09/2023, 2:53 PM

## 2023-12-09 NOTE — Anesthesia Procedure Notes (Signed)
 Spinal  Patient location during procedure: OR End time: 12/09/2023 7:37 AM Reason for block: surgical anesthesia Staffing Performed: anesthesiologist  Anesthesiologist: Leonce Athens, MD Performed by: Leonce Athens, MD Authorized by: Leonce Athens, MD   Preanesthetic Checklist Completed: patient identified, IV checked, site marked, risks and benefits discussed, surgical consent, monitors and equipment checked, pre-op evaluation and timeout performed Spinal Block Patient position: sitting Prep: DuraPrep Patient monitoring: heart rate, cardiac monitor, continuous pulse ox and blood pressure Approach: midline Location: L3-4 Injection technique: single-shot Needle Needle type: Pencan and Introducer  Needle gauge: 24 G Needle length: 9 cm Assessment Sensory level: T4 Events: CSF return Additional Notes Pt identified in Operating room.  Monitors applied. Working IV access confirmed. Sterile prep, drape lumbar spine.  1% lido local L 3,4.  #24ga Pencan into clear CSF L 3,4.  13.5mg  0.75% Bupivacaine with dextrose injected with asp CSF beginning and end of injection.  Patient asymptomatic, VSS, no heme aspirated, tolerated well.  [name] Leonce, MD

## 2023-12-09 NOTE — Op Note (Signed)
 12/09/2023  9:21 AM  PATIENT:  [name]   MRN: [id number]  PRE-OPERATIVE DIAGNOSIS: End-stage right hip osteoarthritis  POST-OPERATIVE DIAGNOSIS:  same  PROCEDURE: Press-fit right total hip arthroplasty   PREOPERATIVE INDICATIONS:  Alois Mincer is an 58 y.o. female who has a diagnosis of End-stage right hip osteoarthritis and elected for surgical management after failing conservative treatment.  The risks benefits and alternatives were discussed with the patient including but not limited to the risks of nonoperative treatment, versus surgical intervention including infection, bleeding, nerve injury, periprosthetic fracture, the need for revision surgery, dislocation, leg length discrepancy, blood clots, cardiopulmonary complications, morbidity, mortality, among others, and they were willing to proceed.     OPERATIVE REPORT     SURGEON:  Toribio Higashi, MD    ASSISTANT: Bernarda Mclean, PA-C, (Present throughout the entire procedure,  necessary for completion of procedure in a timely manner, assisting with retraction, instrumentation, and closure)     ANESTHESIA: Spinal  ESTIMATED BLOOD LOSS: 350cc    COMPLICATIONS:  None.     COMPONENTS:   Stryker 56 mm acetabular shell, 6.5 hex screws x 2, Accolade 2 size #7 stem with 127 degree neck angle, 36-5 mm ceramic head Implant Name Type Inv. Item Serial No. Manufacturer Lot No. LRB No. Used Action  SHELL TRIDENT II CLUST SZ - [id number] Shell SHELL TRIDENT II CLUST SZ  [employer] [id number] A Right 1 Implanted  SCREW HEX LP 6.5X25 - [id number] Screw SCREW HEX LP 6.5X25  [employer] MVFE Right 1 Implanted  SCREW HEX LP 6.5X30 - [id number] Screw SCREW HEX LP 6.5X30  [employer] LXJ Right 1 Implanted  INSERT 0 DEG POLY 36 F - [id number] Miscellaneous INSERT 0 DEG POLY 36 F  [employer] [id number] Right 1 Implanted  STEM HIP - [id number] Hips STEM HIP  [employer]  [id number] A Right 1 Implanted  HEAD BIOLOX HIP 36/-5 - [id number] Joint HEAD BIOLOX HIP 36/-5  [employer] [id number] Right 1 Implanted    The aquamantis was utilized for this case to help facilitate better hemostasis as patient was felt to be at increased risk of bleeding because of obesity.      PROCEDURE IN DETAIL:   The patient was met in the holding area and  identified.  The appropriate hip was identified and marked at the operative site.  The patient was then transported to the OR  and  placed under anesthesia.  At that point, the patient was  placed in the lateral decubitus position with the operative side up and  secured to the operating room table  and all bony prominences padded. A subaxillary role was also placed.    The operative lower extremity was prepped from the iliac crest to the distal leg.  Sterile draping was performed.  Preoperative antibiotics, 2 gm of ancef,1 gm of Tranexamic Acid, and 8 mg of Decadron administered. Time out was performed prior to incision.      A routine posterolateral approach was utilized via sharp dissection  carried down to the subcutaneous tissue.  Gross bleeders were Bovie coagulated.  The iliotibial band was identified and incised along the length of the skin incision through the glute max fascia.  Charnley retractor was placed with care to protect the sciatic nerve posteriorly.  With the hip internally rotated, the piriformis tendon was identified and released from the femoral insertion and tagged with a #5 Ethibond.  A capsulotomy was then performed off the femoral insertion and  also tagged with a #5 Ethibond.    The femoral neck was exposed, and I resected the femoral neck based on preoperative templating relative to the lesser trochanter.    I then exposed the deep acetabulum, cleared out any tissue including the ligamentum teres.  After adequate visualization, I excised the labrum.  I then started reaming with a 50 mm reamer, first medializing  to the floor of the cotyloid fossa, and then in the position of the cup aiming towards the greater sciatic notch, matching the version of the transverse acetabular ligament and tucked under the anterior wall. I reamed up to 56 mm reamer with good bony bed preparation and a 56 mm cup was chosen.  The real cup was then impacted into place.  Appropriate version and inclination was confirmed clinically matching their bony anatomy, and also with the use of the jig.  I placed 2 screws in the posterior superior quadrant to augment fixation.  A neutral liner was placed and impacted. It was confirmed to be appropriately seated and the acetabular retractors were removed.    I then prepared the proximal femur using the box cutter, Charnley awl, and then sequentially broached starting with 0 up to a size 7.  A trial broach, neck, and head was utilized, and I reduced the hip and it was found to have excellent stability.  There was no impingement with full extension and 90 degrees external rotation.  The hip was stable at the position of sleep and with 90 degrees flexion and 70 degrees of internal rotation.  Leg lengths were also clinically assessed in the lateral position and felt to be equal. Intra-Op flatplate was obtained and confirmed appropriate component positions.  Good fill of the femur with the size 7 broach.  And restoration of leg length and offset. No evidence or concern for fracture.  A final femoral prosthesis size 7 was selected. I then impacted the real femoral prosthesis into place.I again trialed and selected a 36 -5mm ball. The hip was then reduced and taken through a range of motion. There was no impingement with full extension and 90 degrees external rotation.  The hip was stable at the position of sleep and with 90 degrees flexion and 70 degrees of internal rotation. Leg lengths were  again assessed and felt to be restored.  We then opened, and I impacted the real head ball into place.  The  posterior capsule was then closed with #5 Ethibond.  The piriformis was repaired through the base of the abductor tendon using a [place] suture passer.  I then irrigated the hip copiously with dilute Betadine and with normal saline pulse lavage. Periarticular injection was then performed with Exparel.   We repaired the fascia #1 barbed suture, followed by 0 barbed suture for the subcutaneous fat.  Skin was closed with 2-0 Vicryl and 3-0 Monocryl.  Dermabond and Aquacel dressing were applied. The patient was then awakened and returned to PACU in stable and satisfactory condition.  Leg lengths in the supine position were assessed and felt to be clinically equal. There were no complications.  Post op recs: WB: WBAT RLE, No formal hip precautions Abx: ancef Imaging: PACU pelvis Xray Dressing: Aquacell, keep intact until follow up DVT prophylaxis: Aspirin 81BID starting POD1 Follow up: 2 weeks after surgery for a wound check with Dr. Edna at [facility].  Address: [address], [place], [place] [address]  Office Phone: [phone]   Toribio Edna, MD Orthopedic  Surgeon

## 2023-12-09 NOTE — Interval H&P Note (Signed)
The patient has been re-examined, and the chart reviewed, and there have been no interval changes to the documented history and physical.    Plan for R THA for R hip OA  The operative side was examined and the patient was confirmed to have sensation to DPN, SPN, TN intact, Motor EHL, ext, flex 5/5, and DP 2+, PT 2+, No significant edema.   The risks, benefits, and alternatives have been discussed at length with patient, and the patient is willing to proceed.  Right hip marked. Consent has been signed.

## 2023-12-10 ENCOUNTER — Encounter (HOSPITAL_COMMUNITY): Payer: Self-pay | Admitting: Orthopedic Surgery

## 2023-12-14 ENCOUNTER — Ambulatory Visit (HOSPITAL_COMMUNITY): Attending: Orthopedic Surgery

## 2023-12-14 ENCOUNTER — Encounter (HOSPITAL_COMMUNITY): Payer: Self-pay

## 2023-12-14 DIAGNOSIS — M25551 Pain in right hip: Secondary | ICD-10-CM | POA: Insufficient documentation

## 2023-12-14 DIAGNOSIS — R2689 Other abnormalities of gait and mobility: Secondary | ICD-10-CM | POA: Diagnosis present

## 2023-12-14 DIAGNOSIS — Z96641 Presence of right artificial hip joint: Secondary | ICD-10-CM | POA: Diagnosis present

## 2023-12-14 NOTE — Therapy (Signed)
 OUTPATIENT PHYSICAL THERAPY LOWER EXTREMITY EVALUATION   Patient Name: [name] MRN: [id number] DOB:May 27, 1965, 58 y.o., female Today's Date: 12/14/2023  END OF SESSION:  PT End of Session - 12/14/23 0730     Visit Number 1    Number of Visits 12    Date for Recertification  03/11/24    Authorization Type [employer]    Authorization Time Period authorization requested    Progress Note Due on Visit 10    PT Start Time 0730    PT Stop Time 0817    PT Time Calculation (min) 47 min    Activity Tolerance Patient tolerated treatment well    Behavior During Therapy WFL for tasks assessed/performed          Past Medical History:  Diagnosis Date   ADHD (attention deficit hyperactivity disorder)    Anxiety    Arthritis    Autoimmune hepatitis (HCC)    Depression    Hypertension    Sleep apnea    does not use cpap very often per pt   Past Surgical History:  Procedure Laterality Date   ABDOMINAL HYSTERECTOMY     benign papilloma removed from right breast      CHOLECYSTECTOMY     KNEE SURGERY Bilateral    nerve root compression     TOTAL HIP ARTHROPLASTY Right 12/09/2023   Procedure: ARTHROPLASTY, HIP, TOTAL,POSTERIOR APPROACH;  Surgeon: Edna Toribio LABOR, MD;  Location: [facility];  Service: Orthopedics;  Laterality: Right;   There are no active problems to display for this patient.   PCP: Samie Frederick, PA-C  REFERRING PROVIDER: Edna Toribio LABOR, MD   REFERRING DIAG: post op right total hip arthroplasty   THERAPY DIAG:  Pain in right hip  Other abnormalities of gait and mobility  Presence of right artificial hip joint  Rationale for Evaluation and Treatment: Rehabilitation  ONSET DATE: 12/09/23  SUBJECTIVE:   SUBJECTIVE STATEMENT: Patient reports that she had a right total hip replacement on 12/09/23. She feels that it has been going well since surgery. She has begun to wean herself off her pain medication. She has been using a walker to get around. She  was told not to bend at her waist prior to surgery. However, she was told by the PT in the hospital that she can bend over as long as it doesn't hurt. She feels that her constipation is her biggest problem right now. She is walking a little right now, but she wanted to start PT prior to walking more. She was given a HEP and she has been doing these for 10 reps twice per day.   PERTINENT HISTORY: ADHD, arthritis, HTN, Graves Disease, and a history of anxiety and depression PAIN:  Are you having pain? Yes: NPRS scale: Current: 1/10 Best: 0/10 Worst: 4/10 Pain location: right lateral hip  Pain description: soreness Aggravating factors: pressure, any twisting Relieving factors: ice  PRECAUTIONS: Posterior hip  RED FLAGS: None   WEIGHT BEARING RESTRICTIONS: No  FALLS:  Has patient fallen in last 6 months? No  LIVING ENVIRONMENT: Lives with: lives with their spouse Lives in: House/apartment Stairs: Yes: External: 4 steps; can reach both; going up the the left leg and down with the right leg first Has following equipment at home: Vannie - 2 wheeled  OCCUPATION: Psychiatric Nurse in [employer]  PLOF: Independent  PATIENT GOALS: be able to walk longer without pain (several miles prior to surgery)and go back to the gym  NEXT MD VISIT:  12/24/23  OBJECTIVE:  Note: Objective measures were completed at Evaluation unless otherwise noted.  DIAGNOSTIC FINDINGS: 12/09/23 Right hip x-ray IMPRESSION: Right hip arthroplasty without immediate postoperative complication.  PATIENT SURVEYS:  LEFS  Extreme difficulty/unable (0), Quite a bit of difficulty (1), Moderate difficulty (2), Little difficulty (3), No difficulty (4) Survey date:  12/14/23  Any of your usual work, housework or school activities 2  2. Usual hobbies, recreational or sporting activities 0  3. Getting into/out of the bath 4  4. Walking between rooms 4  5. Putting on socks/shoes 0  6. Squatting  0  7. Lifting an object,  like a bag of groceries from the floor 0  8. Performing light activities around your home 4  9. Performing heavy activities around your home 0  10. Getting into/out of a car 4  11. Walking 2 blocks 2  12. Walking 1 mile 0  13. Going up/down 10 stairs (1 flight) 3  14. Standing for 1 hour 0  15.  sitting for 1 hour 4  16. Running on even ground 0  17. Running on uneven ground 0  18. Making sharp turns while running fast 0  19. Hopping  0  20. Rolling over in bed 4  Score total:  31/80     COGNITION: Overall cognitive status: Within functional limits for tasks assessed     SENSATION: Patient reports no numbness or tingling  EDEMA:  Mild right hip edema   POSTURE: No Significant postural limitations  PALPATION: TTP: right hip flexor  LOWER EXTREMITY ROM:  Active ROM Right eval Left eval  Hip flexion 96; tight 116  Hip extension    Hip abduction    Hip adduction    Hip internal rotation    Hip external rotation    Knee flexion 126 112  Knee extension 3; pulling in thigh 0  Ankle dorsiflexion    Ankle plantarflexion    Ankle inversion    Ankle eversion     (Blank rows = not tested)  LOWER EXTREMITY MMT: not tested due to surgical condition  MMT Right eval Left eval  Hip flexion    Hip extension    Hip abduction    Hip adduction    Hip internal rotation    Hip external rotation    Knee flexion    Knee extension    Ankle dorsiflexion    Ankle plantarflexion    Ankle inversion    Ankle eversion     (Blank rows = not tested)  LOWER EXTREMITY SPECIAL TESTS:  Not tested due to surgical condition  FUNCTIONAL TESTS:  Timed up and go (TUG): 13.89 seconds with RW; 11.01 seconds without RW  2 minute walk test: 310 feet with RW   GAIT: Distance walked: 310 feet Assistive device utilized: Walker - 2 wheeled Level of assistance: Modified independence Comments: decreased gait speed and stride length; she was able to ambulate short distances without an  assistive device  TREATMENT DATE:   12/14/23: PT evaluation, patient education, and reviewed HEP   PATIENT EDUCATION:  Education details: POC, prognosis, healing, HEP, anatomy, x-ray results, and goals for physical therapy Person educated: Patient Education method: Explanation and Demonstration Education comprehension: verbalized understanding and returned demonstration  HOME EXERCISE PROGRAM: Reviewed physician prescribed HEP (patient exhibited good recall)  ASSESSMENT:  CLINICAL IMPRESSION: Patient is a 58 y.o. female who was seen today for physical therapy evaluation and treatment following a right total hip arthroplasty with a posterior approach on 12/09/23. She presented with low pain severity and irritability with none of today's assessments significantly reproducing her familiar symptoms. Lower extremity manual muscle testing was held at this time due to her surgical condition. She exhibited no signs or symptoms of a post-operative complication. Her HEP was reviewed and she was able to exhibit good recall of these interventions. Recommend that she continue with skilled physical therapy to address her impairments to return to her prior level of function.    OBJECTIVE IMPAIRMENTS: Abnormal gait, decreased activity tolerance, decreased mobility, difficulty walking, decreased ROM, decreased strength, impaired tone, and pain.   ACTIVITY LIMITATIONS: lifting, standing, squatting, stairs, and locomotion level  PARTICIPATION LIMITATIONS: meal prep, cleaning, laundry, shopping, community activity, and occupation  PERSONAL FACTORS: 3+ comorbidities: ADHD, arthritis, HTN, Graves Disease, and a history of anxiety and depression are also affecting patient's functional outcome.   REHAB POTENTIAL: Excellent  CLINICAL DECISION MAKING: Stable/uncomplicated  EVALUATION  COMPLEXITY: Low   GOALS: Goals reviewed with patient? Yes  SHORT TERM GOALS: Target date: 01/04/24 Patient will be independent with her initial HEP.  Baseline: Goal status: INITIAL  2.  Patient will be able to ambulate at least 100 feet without an assistive device for improved household mobility.  Baseline:  Goal status: INITIAL  3.  Patient will improve her right hip flexion to at least 110 degrees for improved function squatting.  Baseline:  Goal status: INITIAL  LONG TERM GOALS: Target date: 01/25/24  Patient will improve her LEFS score to at least 50/80 for improved perceived function with her daily activities.  Baseline:  Goal status: INITIAL  2.  Patient will be able to navigate at least 4 steps with a reciprocal pattern for improved household mobility.  Baseline:  Goal status: INITIAL  3.  Patient will report being able to return to her previous gym routine without being limited by her right hip symptoms to safely return to her prior level of function.  Baseline:  Goal status: INITIAL  4.  Patient will be able to ambulate without an assistive device with no significant gait deviations for improved community mobility.  Baseline:  Goal status: INITIAL  5.  Patient will improve her by at least 50 feet for improved community mobility.  Baseline:  Goal status: INITIAL  PLAN:  PT FREQUENCY: 2x/week  PT DURATION: 6 weeks  PLANNED INTERVENTIONS: 97164- PT Re-evaluation, 97750- Physical Performance Testing, 97110-Therapeutic exercises, 97530- Therapeutic activity, 97112- Neuromuscular re-education, 97535- Self Care, 02859- Manual therapy, [phone]- Gait training, Patient/Family education, Balance training, Stair training, Joint mobilization, Cryotherapy, and Moist heat  PLAN FOR NEXT SESSION: update HEP (as needed), lower extremity strengthening, and gait training   Lacinda JAYSON Fass, PT 12/14/2023, 8:53 AM   [facility] Medicare Auth Request Information Treatment Start  Date: 12/14/23  Date of referral: 11/09/23 Referring provider: Edna Toribio LABOR, MD  Referring diagnosis (ICD 10)? S03.358 Treatment diagnosis (ICD 10)? (if different than referring diagnosis) M25.551, R26.89, Z96.641  What was this (referring  dx) caused by? Surgery (Type: R THA)  Nature of Condition: Initial Onset (within last 3 months)   Laterality: Rt  Current Functional Measure Score: LEFS 31/80  Objective measurements identify impairments when they are compared to normal values, the uninvolved extremity, and prior level of function.  [x]  Yes  []  No  Objective assessment of functional ability: Moderate functional limitations   Briefly describe symptoms: right hip soreness and difficulty walking  How did symptoms start: following surgery on 12/09/23  Average pain intensity:  Last 24 hours: 1/10  Past week: 1/10  How often does the pt experience symptoms? Occasionally  How much have the symptoms interfered with usual daily activities? Quite a bit  How has condition changed since care began at this facility? NA - initial visit  In general, how is the patients overall health? Good   BACK PAIN (STarT Back Screening Tool) No

## 2023-12-17 ENCOUNTER — Encounter (HOSPITAL_COMMUNITY): Payer: Self-pay

## 2023-12-17 ENCOUNTER — Ambulatory Visit (HOSPITAL_COMMUNITY)

## 2023-12-17 DIAGNOSIS — R2689 Other abnormalities of gait and mobility: Secondary | ICD-10-CM

## 2023-12-17 DIAGNOSIS — M25551 Pain in right hip: Secondary | ICD-10-CM | POA: Diagnosis not present

## 2023-12-17 DIAGNOSIS — Z96641 Presence of right artificial hip joint: Secondary | ICD-10-CM

## 2023-12-17 NOTE — Therapy (Signed)
 OUTPATIENT PHYSICAL THERAPY LOWER EXTREMITY TREATMENT   Patient Name: [name] MRN: [id number] DOB:[birth year], 58 y.o., female Today's Date: 12/17/2023  END OF SESSION:  PT End of Session - 12/17/23 0734     Visit Number 2    Number of Visits 12    Date for Recertification  03/11/24    Authorization Type [employer]    Authorization Time Period no authorizatoin required    Progress Note Due on Visit 10    PT Start Time 0734    PT Stop Time 0815    PT Time Calculation (min) 41 min    Activity Tolerance Patient tolerated treatment well    Behavior During Therapy WFL for tasks assessed/performed          Past Medical History:  Diagnosis Date   ADHD (attention deficit hyperactivity disorder)    Anxiety    Arthritis    Autoimmune hepatitis (HCC)    Depression    Hypertension    Sleep apnea    does not use cpap very often per pt   Past Surgical History:  Procedure Laterality Date   ABDOMINAL HYSTERECTOMY     benign papilloma removed from right breast      CHOLECYSTECTOMY     KNEE SURGERY Bilateral    nerve root compression     TOTAL HIP ARTHROPLASTY Right 12/09/2023   Procedure: ARTHROPLASTY, HIP, TOTAL,POSTERIOR APPROACH;  Surgeon: Edna Toribio LABOR, MD;  Location: [facility];  Service: Orthopedics;  Laterality: Right;   There are no active problems to display for this patient.   PCP: Samie Frederick, PA-C  REFERRING PROVIDER: Edna Toribio LABOR, MD   REFERRING DIAG: post op right total hip arthroplasty   THERAPY DIAG:  Pain in right hip  Other abnormalities of gait and mobility  Presence of right artificial hip joint  Rationale for Evaluation and Treatment: Rehabilitation  ONSET DATE: 12/09/23  SUBJECTIVE:   SUBJECTIVE STATEMENT: 12/17/23:  Reports more discomfort in Lt hip when walking and has history of Rt ankle pain, feels her Rt thigh and ankle are tight and would like to learn some stretches.    Eval:  Patient reports that she had a right total  hip replacement on 12/09/23. She feels that it has been going well since surgery. She has begun to wean herself off her pain medication. She has been using a walker to get around. She was told not to bend at her waist prior to surgery. However, she was told by the PT in the hospital that she can bend over as long as it doesn't hurt. She feels that her constipation is her biggest problem right now. She is walking a little right now, but she wanted to start PT prior to walking more. She was given a HEP and she has been doing these for 10 reps twice per day.   PERTINENT HISTORY: ADHD, arthritis, HTN, Graves Disease, and a history of anxiety and depression PAIN:  Are you having pain? Yes: NPRS scale: Current: 2/10 Best: 0/10 Worst: 4/10 Pain location: right lateral hip  Pain description: soreness Aggravating factors: pressure, any twisting Relieving factors: ice  PRECAUTIONS: Posterior hip  RED FLAGS: None   WEIGHT BEARING RESTRICTIONS: No  FALLS:  Has patient fallen in last 6 months? 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  DIAGNOSTIC FINDINGS: 12/09/23 Right hip x-ray IMPRESSION: Right hip arthroplasty without immediate postoperative complication.  PATIENT SURVEYS:  LEFS  Extreme difficulty/unable (0), Quite a bit of difficulty (1), Moderate difficulty (2), Little difficulty (3), No difficulty (4) Survey date:  12/14/23  Any of your usual work, housework or school activities 2  2. Usual  hobbies, recreational or sporting activities 0  3. Getting into/out of the bath 4  4. Walking between rooms 4  5. Putting on socks/shoes 0  6. Squatting  0  7. Lifting an object, like a bag of groceries from the floor 0  8. Performing light activities around your home 4  9. Performing heavy activities around your home 0  10. Getting into/out of a car 4  11. Walking 2 blocks 2  12. Walking 1 mile 0  13. Going up/down 10 stairs (1 flight) 3  14. Standing for 1 hour 0  15.  sitting for 1 hour 4  16. Running on even ground 0  17. Running on uneven ground 0  18. Making sharp turns while running fast 0  19. Hopping  0  20. Rolling over in bed 4  Score total:  31/80     COGNITION: Overall cognitive status: Within functional limits for tasks assessed     SENSATION: Patient reports no numbness or tingling  EDEMA:  Mild right hip edema   POSTURE: No Significant postural limitations  PALPATION: TTP: right hip flexor  LOWER EXTREMITY ROM:  Active ROM Right eval Left eval  Hip flexion 96; tight 116  Hip extension    Hip abduction    Hip adduction    Hip internal rotation    Hip external rotation    Knee flexion 126 112  Knee extension 3; pulling in thigh 0  Ankle dorsiflexion    Ankle plantarflexion    Ankle inversion    Ankle eversion     (Blank rows = not tested)  LOWER EXTREMITY MMT: not tested due to surgical condition  MMT Right eval Left eval  Hip flexion    Hip extension    Hip abduction    Hip adduction    Hip internal rotation    Hip external rotation    Knee flexion    Knee extension    Ankle dorsiflexion    Ankle plantarflexion    Ankle inversion    Ankle eversion     (Blank rows = not tested)  LOWER EXTREMITY SPECIAL TESTS:  Not tested due to surgical condition  FUNCTIONAL TESTS:  Timed up and go (TUG): 13.89 seconds with RW; 11.01 seconds without RW  2 minute walk test: 310 feet with RW   GAIT: Distance walked: 310 feet Assistive  device utilized: Walker - 2 wheeled Level of assistance: Modified independence Comments: decreased gait speed and stride length; she was able to ambulate short distances without an assistive device  TREATMENT DATE:   12/17/23: Reviewed goals Educated importance of HEP compliance for maximal benefits Gait training: heel to toe mechanics x 46ft with RW Attempted clam- unable Supine: Abduction slide Isometric abduction with belt Isometric adduction with ball squeeze Bridge 10x  Seated:  STS no HHA 10x Gastroc stretch shown in standing- doesn't feel stable so completed in seated with strap  12/14/23: PT evaluation, patient education, and reviewed HEP   PATIENT EDUCATION:  Education details: POC, prognosis, healing, HEP, anatomy, x-ray results, and goals for physical therapy Person educated: Patient Education method: Explanation and Demonstration Education comprehension: verbalized understanding and returned demonstration  HOME EXERCISE PROGRAM: Reviewed physician prescribed HEP (patient exhibited good recall)  Access Code: [email or web address] URL: [email or web address] Date: 12/17/2023 Prepared by: Augustin Mclean  Exercises - Hooklying Isometric Hip Abduction Adduction with Belt and Ball  - 1 x daily - 7 x weekly - 3 sets - 10 reps - Supine Bridge  - 1 x daily - 7 x weekly - 3 sets - 10 reps - Standing Hip Abduction with Counter Support  - 1 x daily - 7 x weekly - 3 sets - 10 reps - Standing Hip Extension with Counter Support  - 1 x daily - 7 x weekly - 3 sets - 10 reps - Sit to Stand  - 2 x daily - 7 x weekly - 3 sets - 10 reps - Seated Gastroc Stretch with Strap  - 2 x daily - 7 x weekly - 1 sets - 3 reps - 30 hold  ASSESSMENT:  CLINICAL IMPRESSION: 12/17/23: Reviewed goals and educated importance of HEP.  Pt reports compliance with home  health exercise program and has began the standing exercises added initial eval.  Session focus with gait training, proximal strengthening and stretches for mobility.  Able to complete supine abduction slides with reports of some pulling.  Added isometric exercises for strengthening  with improved tolerance.  Added bridges and sit to stands for gluteal strengthening with good tolerance.  Added gastroc stretch to address c/o tightness.  Given new exercise program printout with verbalized understanding.    Eval:  Patient is a 58 y.o. female who was seen today for physical therapy evaluation and treatment following a right total hip arthroplasty with a posterior approach on 12/09/23. She presented with low pain severity and irritability with none of today's assessments significantly reproducing her familiar symptoms. Lower extremity manual muscle testing was held at this time due to her surgical condition. She exhibited no signs or symptoms of a post-operative complication. Her HEP was reviewed and she was able to exhibit good recall of these interventions. Recommend that she continue with skilled physical therapy to address her impairments to return to her prior level of function.    OBJECTIVE IMPAIRMENTS: Abnormal gait, decreased activity tolerance, decreased mobility, difficulty walking, decreased ROM, decreased strength, impaired tone, and pain.   ACTIVITY LIMITATIONS: lifting, standing, squatting, stairs, and locomotion level  PARTICIPATION LIMITATIONS: meal prep, cleaning, laundry, shopping, community activity, and occupation  PERSONAL FACTORS: 3+ comorbidities: ADHD, arthritis, HTN, Graves Disease, and a history of anxiety and depression are also affecting patient's functional outcome.   REHAB POTENTIAL: Excellent  CLINICAL DECISION MAKING: Stable/uncomplicated  EVALUATION COMPLEXITY: Low   GOALS: Goals reviewed with patient? Yes  SHORT TERM GOALS: Target date: 01/04/24 Patient will be  independent with her initial HEP.  Baseline: Goal status: INITIAL  2.  Patient will be able to ambulate at least 100 feet without an assistive device for improved household  mobility.  Baseline:  Goal status: INITIAL  3.  Patient will improve her right hip flexion to at least 110 degrees for improved function squatting.  Baseline:  Goal status: INITIAL  LONG TERM GOALS: Target date: 01/25/24  Patient will improve her LEFS score to at least 50/80 for improved perceived function with her daily activities.  Baseline:  Goal status: INITIAL  2.  Patient will be able to navigate at least 4 steps with a reciprocal pattern for improved household mobility.  Baseline:  Goal status: INITIAL  3.  Patient will report being able to return to her previous gym routine without being limited by her right hip symptoms to safely return to her prior level of function.  Baseline:  Goal status: INITIAL  4.  Patient will be able to ambulate without an assistive device with no significant gait deviations for improved community mobility.  Baseline:  Goal status: INITIAL  5.  Patient will improve her by at least 50 feet for improved community mobility.  Baseline:  Goal status: INITIAL  PLAN:  PT FREQUENCY: 2x/week  PT DURATION: 6 weeks  PLANNED INTERVENTIONS: 97164- PT Re-evaluation, 97750- Physical Performance Testing, 97110-Therapeutic exercises, 97530- Therapeutic activity, [id number]- Neuromuscular re-education, 97535- Self Care, [id number]- Manual therapy, [phone]- Gait training, Patient/Family education, Balance training, Stair training, Joint mobilization, Cryotherapy, and Moist heat  PLAN FOR NEXT SESSION: update HEP (as needed), lower extremity strengthening, and gait training.  Add rocker board and heel/ toe raises next session.  Augustin Mclean, LPTA/CLT; CBIS [phone]  Mclean Augustin Amble, PTA 12/17/2023, 12:41 PM

## 2023-12-21 ENCOUNTER — Encounter (HOSPITAL_COMMUNITY)

## 2023-12-22 ENCOUNTER — Ambulatory Visit (HOSPITAL_COMMUNITY)

## 2023-12-22 ENCOUNTER — Encounter (HOSPITAL_COMMUNITY): Payer: Self-pay

## 2023-12-22 DIAGNOSIS — Z96641 Presence of right artificial hip joint: Secondary | ICD-10-CM

## 2023-12-22 DIAGNOSIS — M25551 Pain in right hip: Secondary | ICD-10-CM

## 2023-12-22 DIAGNOSIS — R2689 Other abnormalities of gait and mobility: Secondary | ICD-10-CM

## 2023-12-22 NOTE — Therapy (Signed)
 OUTPATIENT PHYSICAL THERAPY LOWER EXTREMITY TREATMENT   Patient Name: [name] MRN: [id number] DOB:Sep 05, 1965, 58 y.o., female Today's Date: 12/22/2023  END OF SESSION:  PT End of Session - 12/22/23 1632     Visit Number 3    Number of Visits 12    Date for Recertification  03/11/24    Authorization Type UHC    Authorization Time Period no authorizatoin required    Progress Note Due on Visit 10    PT Start Time 1632    PT Stop Time 1713    PT Time Calculation (min) 41 min    Activity Tolerance Patient tolerated treatment well    Behavior During Therapy WFL for tasks assessed/performed           Past Medical History:  Diagnosis Date   ADHD (attention deficit hyperactivity disorder)    Anxiety    Arthritis    Autoimmune hepatitis (HCC)    Depression    Hypertension    Sleep apnea    does not use cpap very often per pt   Past Surgical History:  Procedure Laterality Date   ABDOMINAL HYSTERECTOMY     benign papilloma removed from right breast      CHOLECYSTECTOMY     KNEE SURGERY Bilateral    nerve root compression     TOTAL HIP ARTHROPLASTY Right 12/09/2023   Procedure: ARTHROPLASTY, HIP, TOTAL,POSTERIOR APPROACH;  Surgeon: Edna Toribio LABOR, MD;  Location: [facility];  Service: Orthopedics;  Laterality: Right;   There are no active problems to display for this patient.   PCP: Samie Frederick, PA-C  REFERRING PROVIDER: Edna Toribio LABOR, MD   REFERRING DIAG: post op right total hip arthroplasty   THERAPY DIAG:  Pain in right hip  Other abnormalities of gait and mobility  Presence of right artificial hip joint  Rationale for Evaluation and Treatment: Rehabilitation  ONSET DATE: 12/09/23  SUBJECTIVE:   SUBJECTIVE STATEMENT: Patient reports that she is not hurting today, but she is sore. She is not using her walker at home, but only uses it when she goes out.   Eval:  Patient reports that she had a right total hip replacement on 12/09/23. She  feels that it has been going well since surgery. She has begun to wean herself off her pain medication. She has been using a walker to get around. She was told not to bend at her waist prior to surgery. However, she was told by the PT in the hospital that she can bend over as long as it doesn't hurt. She feels that her constipation is her biggest problem right now. She is walking a little right now, but she wanted to start PT prior to walking more. She was given a HEP and she has been doing these for 10 reps twice per day.   PERTINENT HISTORY: ADHD, arthritis, HTN, Graves Disease, and a history of anxiety and depression PAIN:  Are you having pain? Yes: NPRS scale: Current: 1/10 Best: 0/10 Worst: 4/10 Pain location: right lateral hip  Pain description: soreness Aggravating factors: pressure, any twisting Relieving factors: ice  PRECAUTIONS: Posterior hip  RED FLAGS: None   WEIGHT BEARING RESTRICTIONS: No  FALLS:  Has patient fallen in last 6 months? No  LIVING ENVIRONMENT: Lives with: lives with their spouse Lives in: House/apartment Stairs: Yes: External: 4 steps; can reach both; going up the the left leg and down with the right leg first Has following equipment at home: Vannie - 2 wheeled  OCCUPATION: Psychiatric Nurse in [employer]  PLOF: Independent  PATIENT GOALS: be able to walk longer without pain (several miles prior to surgery)and go back to the gym  NEXT MD VISIT: 12/24/23  OBJECTIVE:  Note: Objective measures were completed at Evaluation unless otherwise noted.  DIAGNOSTIC FINDINGS: 12/09/23 Right hip x-ray IMPRESSION: Right hip arthroplasty without immediate postoperative complication.  PATIENT SURVEYS:  LEFS  Extreme difficulty/unable (0), Quite a bit of difficulty (1), Moderate difficulty (2), Little difficulty (3), No difficulty (4) Survey date:  12/14/23  Any of your usual work, housework or school activities 2  2. Usual hobbies, recreational or  sporting activities 0  3. Getting into/out of the bath 4  4. Walking between rooms 4  5. Putting on socks/shoes 0  6. Squatting  0  7. Lifting an object, like a bag of groceries from the floor 0  8. Performing light activities around your home 4  9. Performing heavy activities around your home 0  10. Getting into/out of a car 4  11. Walking 2 blocks 2  12. Walking 1 mile 0  13. Going up/down 10 stairs (1 flight) 3  14. Standing for 1 hour 0  15.  sitting for 1 hour 4  16. Running on even ground 0  17. Running on uneven ground 0  18. Making sharp turns while running fast 0  19. Hopping  0  20. Rolling over in bed 4  Score total:  31/80     COGNITION: Overall cognitive status: Within functional limits for tasks assessed     SENSATION: Patient reports no numbness or tingling  EDEMA:  Mild right hip edema   POSTURE: No Significant postural limitations  PALPATION: TTP: right hip flexor  LOWER EXTREMITY ROM:  Active ROM Right eval Left eval  Hip flexion 96; tight 116  Hip extension    Hip abduction    Hip adduction    Hip internal rotation    Hip external rotation    Knee flexion 126 112  Knee extension 3; pulling in thigh 0  Ankle dorsiflexion    Ankle plantarflexion    Ankle inversion    Ankle eversion     (Blank rows = not tested)  LOWER EXTREMITY MMT: not tested due to surgical condition  MMT Right eval Left eval  Hip flexion    Hip extension    Hip abduction    Hip adduction    Hip internal rotation    Hip external rotation    Knee flexion    Knee extension    Ankle dorsiflexion    Ankle plantarflexion    Ankle inversion    Ankle eversion     (Blank rows = not tested)  LOWER EXTREMITY SPECIAL TESTS:  Not tested due to surgical condition  FUNCTIONAL TESTS:  Timed up and go (TUG): 13.89 seconds with RW; 11.01 seconds without RW  2 minute walk test: 310 feet with RW   GAIT: Distance walked: 310 feet Assistive device utilized: Walker -  2 wheeled Level of assistance: Modified independence Comments: decreased gait speed and stride length; she was able to ambulate short distances without an assistive device  TREATMENT DATE:                                      EXERCISE LOG  Exercise Repetitions and Resistance Comments  Nustep  L3 x 7 minutes LE only   Rocker board  2 minutes  With BUE support from parallel bars   Static stance on foam   2 minutes  Without UE support   Marching on foam  2 minutes  Without BUE support from parallel bars   Side stepping over hurdles  3 laps without hurdles  5 laps with hurdles  Without parallel bars   Squatting  15 reps  With parallel bars   Stairs  4 step progressing from step-to to reciprocal pattern then moving to 6 steps progressing from step-to to reciprocal pattern    2 MWT 339 feet  Without AD   Lunges onto step  6 step x 10 reps w/ 10 second hold  Left foot on step   Blank cell = exercise not performed today   12/17/23: Reviewed goals Educated importance of HEP compliance for maximal benefits Gait training: heel to toe mechanics x 22ft with RW Attempted clam- unable Supine: Abduction slide Isometric abduction with belt Isometric adduction with ball squeeze Bridge 10x  Seated:  STS no HHA 10x Gastroc stretch shown in standing- doesn't feel stable so completed in seated with strap  12/14/23: PT evaluation, patient education, and reviewed HEP   PATIENT EDUCATION:  Education details: healing and expectations for soreness Person educated: Patient Education method: Medical Illustrator Education comprehension: verbalized understanding and returned demonstration  HOME EXERCISE PROGRAM: Reviewed physician prescribed HEP (patient exhibited good recall)  Access Code: [email or web address] URL: [email or web address] Date:  12/17/2023 Prepared by: Augustin Mclean  Exercises - Hooklying Isometric Hip Abduction Adduction with Belt and Ball  - 1 x daily - 7 x weekly - 3 sets - 10 reps - Supine Bridge  - 1 x daily - 7 x weekly - 3 sets - 10 reps - Standing Hip Abduction with Counter Support  - 1 x daily - 7 x weekly - 3 sets - 10 reps - Standing Hip Extension with Counter Support  - 1 x daily - 7 x weekly - 3 sets - 10 reps - Sit to Stand  - 2 x daily - 7 x weekly - 3 sets - 10 reps - Seated Gastroc Stretch with Strap  - 2 x daily - 7 x weekly - 1 sets - 3 reps - 30 hold  ASSESSMENT:  CLINICAL IMPRESSION: Patient was introduced to multiple new standing interventions for improved right hip mobility and strength with moderate difficulty and fatigue. She required minimal cueing with today's new interventions for proper exercise performance. Fatigue was her primary limitation with today's interventions as she required brief rest breaks throughout treatment. She reported that her hip felt tired and a little sore upon the conclusion of treatment. Patient continues to require skilled physical therapy to address her remaining impairments to return to her prior level of function.    Eval:  Patient is a 58 y.o. female who was seen today for physical therapy evaluation and treatment following a right total hip arthroplasty with a posterior approach on 12/09/23. She presented with low pain severity and irritability with none of today's assessments significantly reproducing her familiar symptoms. Lower extremity manual muscle testing was held at this time due to her surgical condition.  She exhibited no signs or symptoms of a post-operative complication. Her HEP was reviewed and she was able to exhibit good recall of these interventions. Recommend that she continue with skilled physical therapy to address her impairments to return to her prior level of function.    OBJECTIVE IMPAIRMENTS: Abnormal gait, decreased activity tolerance,  decreased mobility, difficulty walking, decreased ROM, decreased strength, impaired tone, and pain.   ACTIVITY LIMITATIONS: lifting, standing, squatting, stairs, and locomotion level  PARTICIPATION LIMITATIONS: meal prep, cleaning, laundry, shopping, community activity, and occupation  PERSONAL FACTORS: 3+ comorbidities: ADHD, arthritis, HTN, Graves Disease, and a history of anxiety and depression are also affecting patient's functional outcome.   REHAB POTENTIAL: Excellent  CLINICAL DECISION MAKING: Stable/uncomplicated  EVALUATION COMPLEXITY: Low   GOALS: Goals reviewed with patient? Yes  SHORT TERM GOALS: Target date: 01/04/24 Patient will be independent with her initial HEP.  Baseline: Goal status: INITIAL  2.  Patient will be able to ambulate at least 100 feet without an assistive device for improved household mobility.  Baseline:  Goal status: INITIAL  3.  Patient will improve her right hip flexion to at least 110 degrees for improved function squatting.  Baseline:  Goal status: INITIAL  LONG TERM GOALS: Target date: 01/25/24  Patient will improve her LEFS score to at least 50/80 for improved perceived function with her daily activities.  Baseline:  Goal status: INITIAL  2.  Patient will be able to navigate at least 4 steps with a reciprocal pattern for improved household mobility.  Baseline:  Goal status: INITIAL  3.  Patient will report being able to return to her previous gym routine without being limited by her right hip symptoms to safely return to her prior level of function.  Baseline:  Goal status: INITIAL  4.  Patient will be able to ambulate without an assistive device with no significant gait deviations for improved community mobility.  Baseline:  Goal status: INITIAL  5.  Patient will improve her by at least 50 feet for improved community mobility.  Baseline:  Goal status: INITIAL  PLAN:  PT FREQUENCY: 2x/week  PT DURATION: 6  weeks  PLANNED INTERVENTIONS: 97164- PT Re-evaluation, 97750- Physical Performance Testing, 97110-Therapeutic exercises, 97530- Therapeutic activity, [id number]- Neuromuscular re-education, 97535- Self Care, 02859- Manual therapy, [phone]- Gait training, Patient/Family education, Balance training, Stair training, Joint mobilization, Cryotherapy, and Moist heat  PLAN FOR NEXT SESSION: update HEP (as needed), lower extremity strengthening, and gait training.  Add rocker board and heel/ toe raises next session.   Lacinda JAYSON Fass, PT 12/22/2023, 5:17 PM

## 2023-12-24 ENCOUNTER — Ambulatory Visit (HOSPITAL_COMMUNITY)

## 2023-12-24 ENCOUNTER — Encounter (HOSPITAL_COMMUNITY): Payer: Self-pay

## 2023-12-24 DIAGNOSIS — Z96641 Presence of right artificial hip joint: Secondary | ICD-10-CM

## 2023-12-24 DIAGNOSIS — M25551 Pain in right hip: Secondary | ICD-10-CM | POA: Diagnosis not present

## 2023-12-24 DIAGNOSIS — R2689 Other abnormalities of gait and mobility: Secondary | ICD-10-CM

## 2023-12-24 NOTE — Therapy (Signed)
 OUTPATIENT PHYSICAL THERAPY LOWER EXTREMITY TREATMENT   Patient Name: [name] MRN: [id number] DOB:1965/08/26, 58 y.o., female Today's Date: 12/24/2023  END OF SESSION:  PT End of Session - 12/24/23 0737     Visit Number 4    Number of Visits 12    Date for Recertification  03/11/24    Authorization Type [employer]    Authorization Time Period no authorizatoin required    Progress Note Due on Visit 10    PT Start Time 0734    PT Stop Time 0815    PT Time Calculation (min) 41 min    Activity Tolerance Patient tolerated treatment well    Behavior During Therapy WFL for tasks assessed/performed            Past Medical History:  Diagnosis Date   ADHD (attention deficit hyperactivity disorder)    Anxiety    Arthritis    Autoimmune hepatitis (HCC)    Depression    Hypertension    Sleep apnea    does not use cpap very often per pt   Past Surgical History:  Procedure Laterality Date   ABDOMINAL HYSTERECTOMY     benign papilloma removed from right breast      CHOLECYSTECTOMY     KNEE SURGERY Bilateral    nerve root compression     TOTAL HIP ARTHROPLASTY Right 12/09/2023   Procedure: ARTHROPLASTY, HIP, TOTAL,POSTERIOR APPROACH;  Surgeon: Edna Toribio LABOR, MD;  Location: [facility];  Service: Orthopedics;  Laterality: Right;   There are no active problems to display for this patient.   PCP: Samie Frederick, PA-C  REFERRING PROVIDER: Edna Toribio LABOR, MD   REFERRING DIAG: post op right total hip arthroplasty   THERAPY DIAG:  Pain in right hip  Other abnormalities of gait and mobility  Presence of right artificial hip joint  Rationale for Evaluation and Treatment: Rehabilitation  ONSET DATE: 12/09/23  SUBJECTIVE:   SUBJECTIVE STATEMENT: Patient reports that she was sore the day after her last appointment, but she is stiff today. She is scheduled to see her physician later today.   Eval:  Patient reports that she had a right total hip replacement on  12/09/23. She feels that it has been going well since surgery. She has begun to wean herself off her pain medication. She has been using a walker to get around. She was told not to bend at her waist prior to surgery. However, she was told by the PT in the hospital that she can bend over as long as it doesn't hurt. She feels that her constipation is her biggest problem right now. She is walking a little right now, but she wanted to start PT prior to walking more. She was given a HEP and she has been doing these for 10 reps twice per day.   PERTINENT HISTORY: ADHD, arthritis, HTN, Graves Disease, and a history of anxiety and depression PAIN:  Are you having pain? Yes: NPRS scale: Current: 0/10 Best: 0/10 Worst: 4/10 Pain location: right lateral hip  Pain description: soreness Aggravating factors: pressure, any twisting Relieving factors: ice  PRECAUTIONS: Posterior hip  RED FLAGS: None   WEIGHT BEARING RESTRICTIONS: No  FALLS:  Has patient fallen in last 6 months? No  LIVING ENVIRONMENT: Lives with: lives with their spouse Lives in: House/apartment Stairs: Yes: External: 4 steps; can reach both; going up the the left leg and down with the right leg first Has following equipment at home: Walker - 2 wheeled  OCCUPATION:  Psychiatric Nurse in [employer]  PLOF: Independent  PATIENT GOALS: be able to walk longer without pain (several miles prior to surgery)and go back to the gym  NEXT MD VISIT: 12/24/23  OBJECTIVE:  Note: Objective measures were completed at Evaluation unless otherwise noted.  DIAGNOSTIC FINDINGS: 12/09/23 Right hip x-ray IMPRESSION: Right hip arthroplasty without immediate postoperative complication.  PATIENT SURVEYS:  LEFS  Extreme difficulty/unable (0), Quite a bit of difficulty (1), Moderate difficulty (2), Little difficulty (3), No difficulty (4) Survey date:  12/14/23 12/24/23  Any of your usual work, housework or school activities 2 3  2. Usual  hobbies, recreational or sporting activities 0 4  3. Getting into/out of the bath 4 4  4. Walking between rooms 4 4  5. Putting on socks/shoes 0 1  6. Squatting  0 2  7. Lifting an object, like a bag of groceries from the floor 0 3  8. Performing light activities around your home 4 4  9. Performing heavy activities around your home 0 0  10. Getting into/out of a car 4 4  11. Walking 2 blocks 2 4  12. Walking 1 mile 0 0  13. Going up/down 10 stairs (1 flight) 3 3  14. Standing for 1 hour 0 0  15.  sitting for 1 hour 4 4  16. Running on even ground 0 0  17. Running on uneven ground 0 0  18. Making sharp turns while running fast 0 0  19. Hopping  0 0  20. Rolling over in bed 4 4  Score total:  31/80 44/80     COGNITION: Overall cognitive status: Within functional limits for tasks assessed     SENSATION: Patient reports no numbness or tingling  EDEMA:  Mild right hip edema   POSTURE: No Significant postural limitations  PALPATION: TTP: right hip flexor  LOWER EXTREMITY ROM:  Active ROM Right eval Left eval  Hip flexion 96; tight 116  Hip extension    Hip abduction    Hip adduction    Hip internal rotation    Hip external rotation    Knee flexion 126 112  Knee extension 3; pulling in thigh 0  Ankle dorsiflexion    Ankle plantarflexion    Ankle inversion    Ankle eversion     (Blank rows = not tested)  LOWER EXTREMITY MMT: not tested due to surgical condition  MMT Right eval Left eval  Hip flexion    Hip extension    Hip abduction    Hip adduction    Hip internal rotation    Hip external rotation    Knee flexion    Knee extension    Ankle dorsiflexion    Ankle plantarflexion    Ankle inversion    Ankle eversion     (Blank rows = not tested)  LOWER EXTREMITY SPECIAL TESTS:  Not tested due to surgical condition  FUNCTIONAL TESTS:  Timed up and go (TUG): 13.89 seconds with RW; 11.01 seconds without RW  2 minute walk test: 310 feet with RW    GAIT: Distance walked: 310 feet Assistive device utilized: Walker - 2 wheeled Level of assistance: Modified independence Comments: decreased gait speed and stride length; she was able to ambulate short distances without an assistive device  TREATMENT DATE:                                     12/24/23 EXERCISE LOG  Exercise Repetitions and Resistance Comments  Nustep  L1 x 8 minutes    Squatting   20 reps  Without UE support   Stairs   5 x 4 steps  With reciprocal pattern; without UE support   Goal assessment  See below   Stepping over hurdles   5 laps x 20 feet  Various heights   Sit to stand  15 reps  With tidal tank at chest   Walking with tidal tank   1 lap around gym     Blank cell = exercise not performed today                                     12/22/23 EXERCISE LOG  Exercise Repetitions and Resistance Comments  Nustep  L3 x 7 minutes LE only   Rocker board  2 minutes  With BUE support from parallel bars   Static stance on foam   2 minutes  Without UE support   Marching on foam  2 minutes  Without BUE support from parallel bars   Side stepping over hurdles  3 laps without hurdles  5 laps with hurdles  Without parallel bars   Squatting  15 reps  With parallel bars   Stairs  4 step progressing from step-to to reciprocal pattern then moving to 6 steps progressing from step-to to reciprocal pattern    2 MWT 339 feet  Without AD   Lunges onto step  6 step x 10 reps w/ 10 second hold  Left foot on step   Blank cell = exercise not performed today   12/17/23: Reviewed goals Educated importance of HEP compliance for maximal benefits Gait training: heel to toe mechanics x 66ft with RW Attempted clam- unable Supine: Abduction slide Isometric abduction with belt Isometric adduction with ball squeeze Bridge 10x  Seated:  STS no HHA 10x Gastroc  stretch shown in standing- doesn't feel stable so completed in seated with strap  PATIENT EDUCATION:  Education details: healing and expectations for soreness Person educated: Patient Education method: Medical Illustrator Education comprehension: verbalized understanding and returned demonstration  HOME EXERCISE PROGRAM: Reviewed physician prescribed HEP (patient exhibited good recall)  Access Code: [email or web address] URL: [email or web address] Date: 12/17/2023 Prepared by: Augustin Mclean  Exercises - Hooklying Isometric Hip Abduction Adduction with Belt and Ball  - 1 x daily - 7 x weekly - 3 sets - 10 reps - Supine Bridge  - 1 x daily - 7 x weekly - 3 sets - 10 reps - Standing Hip Abduction with Counter Support  - 1 x daily - 7 x weekly - 3 sets - 10 reps - Standing Hip Extension with Counter Support  - 1 x daily - 7 x weekly - 3 sets - 10 reps - Sit to Stand  - 2 x daily - 7 x weekly - 3 sets - 10 reps - Seated Gastroc Stretch with Strap  - 2 x daily - 7 x weekly - 1 sets - 3 reps - 30 hold  ASSESSMENT:  CLINICAL IMPRESSION: Patient is making excellent progress with skilled physical therapy as evidenced by her LEFS score, objective measures, functional mobility, and progress  toward her goals. She was able to meet most of her short term goals and is making progress toward her long term goals. She is able to navigate at least four steps with a reciprocal pattern and without upper extremity support. However, she continues to exhibit mild gait deviations and reduced muscular endurance.Today's treatment focused on new interventions for improved dynamic stability with functional mobility with moderate difficulty. She reported that her quadriceps felt tired upon the conclusion of treatment. Recommend that she continue with skilled physical therapy to address her remaining impairments to return to her prior level of function.    Eval:  Patient is a 58 y.o. female who was seen  today for physical therapy evaluation and treatment following a right total hip arthroplasty with a posterior approach on 12/09/23. She presented with low pain severity and irritability with none of today's assessments significantly reproducing her familiar symptoms. Lower extremity manual muscle testing was held at this time due to her surgical condition. She exhibited no signs or symptoms of a post-operative complication. Her HEP was reviewed and she was able to exhibit good recall of these interventions. Recommend that she continue with skilled physical therapy to address her impairments to return to her prior level of function.    OBJECTIVE IMPAIRMENTS: Abnormal gait, decreased activity tolerance, decreased mobility, difficulty walking, decreased ROM, decreased strength, impaired tone, and pain.   ACTIVITY LIMITATIONS: lifting, standing, squatting, stairs, and locomotion level  PARTICIPATION LIMITATIONS: meal prep, cleaning, laundry, shopping, community activity, and occupation  PERSONAL FACTORS: 3+ comorbidities: ADHD, arthritis, HTN, Graves Disease, and a history of anxiety and depression are also affecting patient's functional outcome.   REHAB POTENTIAL: Excellent  CLINICAL DECISION MAKING: Stable/uncomplicated  EVALUATION COMPLEXITY: Low   GOALS: Goals reviewed with patient? Yes  SHORT TERM GOALS: Target date: 01/04/24 Patient will be independent with her initial HEP.  Baseline: Goal status: MET  2.  Patient will be able to ambulate at least 100 feet without an assistive device for improved household mobility.  Baseline:  Goal status: MET  3.  Patient will improve her right hip flexion to at least 110 degrees for improved function squatting.  Baseline: 104 degrees on 12/24/23 Goal status: ON GOING  LONG TERM GOALS: Target date: 01/25/24  Patient will improve her LEFS score to at least 50/80 for improved perceived function with her daily activities.  Baseline: 12/24/23:  44/80 Goal status: ON GOING  2.  Patient will be able to navigate at least 4 steps with a reciprocal pattern for improved household mobility.  Baseline: able to complete without UE support  Goal status: MET  3.  Patient will report being able to return to her previous gym routine without being limited by her right hip symptoms to safely return to her prior level of function.  Baseline:  Goal status: ON GOING   4.  Patient will be able to ambulate without an assistive device with no significant gait deviations for improved community mobility.  Baseline:  Goal status: ON GOING  5.  Patient will improve her by at least 50 feet for improved community mobility.  Baseline: 29 feet on 12/22/23 Goal status: ON GOING  PLAN:  PT FREQUENCY: 2x/week  PT DURATION: 6 weeks  PLANNED INTERVENTIONS: 97164- PT Re-evaluation, 97750- Physical Performance Testing, 97110-Therapeutic exercises, 97530- Therapeutic activity, [id number]- Neuromuscular re-education, 97535- Self Care, [id number]- Manual therapy, [id number]- Gait training, Patient/Family education, Balance training, Stair training, Joint mobilization, Cryotherapy, and Moist heat  PLAN FOR NEXT SESSION: update HEP (  as needed), lower extremity strengthening, and gait training.  Add rocker board and heel/ toe raises next session.   Lacinda JAYSON Fass, PT 12/24/2023, 12:29 PM

## 2023-12-28 ENCOUNTER — Encounter (HOSPITAL_COMMUNITY): Payer: Self-pay

## 2023-12-28 ENCOUNTER — Ambulatory Visit (HOSPITAL_COMMUNITY)

## 2023-12-28 DIAGNOSIS — R2689 Other abnormalities of gait and mobility: Secondary | ICD-10-CM

## 2023-12-28 DIAGNOSIS — M25551 Pain in right hip: Secondary | ICD-10-CM

## 2023-12-28 DIAGNOSIS — Z96641 Presence of right artificial hip joint: Secondary | ICD-10-CM

## 2023-12-28 NOTE — Therapy (Signed)
 OUTPATIENT PHYSICAL THERAPY LOWER EXTREMITY TREATMENT   Patient Name: [name] MRN: [id number] DOB:08/04/1965, 58 y.o., female Today's Date: 12/28/2023  END OF SESSION:  PT End of Session - 12/28/23 0731     Visit Number 5    Number of Visits 12    Date for Recertification  03/11/24    Authorization Type [employer]    Authorization Time Period no authorizatoin required    Progress Note Due on Visit 10    PT Start Time 0731    PT Stop Time 0813    PT Time Calculation (min) 42 min    Activity Tolerance Patient tolerated treatment well    Behavior During Therapy WFL for tasks assessed/performed            Past Medical History:  Diagnosis Date   ADHD (attention deficit hyperactivity disorder)    Anxiety    Arthritis    Autoimmune hepatitis (HCC)    Depression    Hypertension    Sleep apnea    does not use cpap very often per pt   Past Surgical History:  Procedure Laterality Date   ABDOMINAL HYSTERECTOMY     benign papilloma removed from right breast      CHOLECYSTECTOMY     KNEE SURGERY Bilateral    nerve root compression     TOTAL HIP ARTHROPLASTY Right 12/09/2023   Procedure: ARTHROPLASTY, HIP, TOTAL,POSTERIOR APPROACH;  Surgeon: Edna Toribio LABOR, MD;  Location: [facility];  Service: Orthopedics;  Laterality: Right;   There are no active problems to display for this patient.   PCP: Samie Frederick, PA-C  REFERRING PROVIDER: Edna Toribio LABOR, MD   REFERRING DIAG: post op right total hip arthroplasty   THERAPY DIAG:  Pain in right hip  Other abnormalities of gait and mobility  Presence of right artificial hip joint  Rationale for Evaluation and Treatment: Rehabilitation  ONSET DATE: 12/09/23  SUBJECTIVE:   SUBJECTIVE STATEMENT: Patient reports that she was really sore after her last appointment. She notes that her surgeon is happy with her progress and she was cleared to drive. Her soreness got up to 8/10 on Friday. She was not given any  precautions or restrictions when she saw her surgeon.  Eval:  Patient reports that she had a right total hip replacement on 12/09/23. She feels that it has been going well since surgery. She has begun to wean herself off her pain medication. She has been using a walker to get around. She was told not to bend at her waist prior to surgery. However, she was told by the PT in the hospital that she can bend over as long as it doesn't hurt. She feels that her constipation is her biggest problem right now. She is walking a little right now, but she wanted to start PT prior to walking more. She was given a HEP and she has been doing these for 10 reps twice per day.   PERTINENT HISTORY: ADHD, arthritis, HTN, Graves Disease, and a history of anxiety and depression PAIN:  Are you having pain? Yes: NPRS scale: Current: 5/10 Best: 0/10 Worst: 4/10 Pain location: right lateral hip  Pain description: soreness Aggravating factors: pressure, any twisting Relieving factors: ice  PRECAUTIONS: Posterior hip  RED FLAGS: None   WEIGHT BEARING RESTRICTIONS: No  FALLS:  Has patient fallen in last 6 months? No  LIVING ENVIRONMENT: Lives with: lives with their spouse Lives in: House/apartment Stairs: Yes: External: 4 steps; can reach both; going up the  the left leg and down with the right leg first Has following equipment at home: Vannie - 2 wheeled  OCCUPATION: Psychiatric Nurse in [employer]  PLOF: Independent  PATIENT GOALS: be able to walk longer without pain (several miles prior to surgery)and go back to the gym  NEXT MD VISIT: 01/21/24  OBJECTIVE:  Note: Objective measures were completed at Evaluation unless otherwise noted.  DIAGNOSTIC FINDINGS: 12/09/23 Right hip x-ray IMPRESSION: Right hip arthroplasty without immediate postoperative complication.  PATIENT SURVEYS:  LEFS  Extreme difficulty/unable (0), Quite a bit of difficulty (1), Moderate difficulty (2), Little difficulty (3), No  difficulty (4) Survey date:  12/14/23 12/24/23  Any of your usual work, housework or school activities 2 3  2. Usual hobbies, recreational or sporting activities 0 4  3. Getting into/out of the bath 4 4  4. Walking between rooms 4 4  5. Putting on socks/shoes 0 1  6. Squatting  0 2  7. Lifting an object, like a bag of groceries from the floor 0 3  8. Performing light activities around your home 4 4  9. Performing heavy activities around your home 0 0  10. Getting into/out of a car 4 4  11. Walking 2 blocks 2 4  12. Walking 1 mile 0 0  13. Going up/down 10 stairs (1 flight) 3 3  14. Standing for 1 hour 0 0  15.  sitting for 1 hour 4 4  16. Running on even ground 0 0  17. Running on uneven ground 0 0  18. Making sharp turns while running fast 0 0  19. Hopping  0 0  20. Rolling over in bed 4 4  Score total:  31/80 44/80     COGNITION: Overall cognitive status: Within functional limits for tasks assessed     SENSATION: Patient reports no numbness or tingling  EDEMA:  Mild right hip edema   POSTURE: No Significant postural limitations  PALPATION: TTP: right hip flexor  LOWER EXTREMITY ROM:  Active ROM Right eval Left eval  Hip flexion 96; tight 116  Hip extension    Hip abduction    Hip adduction    Hip internal rotation    Hip external rotation    Knee flexion 126 112  Knee extension 3; pulling in thigh 0  Ankle dorsiflexion    Ankle plantarflexion    Ankle inversion    Ankle eversion     (Blank rows = not tested)  LOWER EXTREMITY MMT: not tested due to surgical condition  MMT Right eval Left eval  Hip flexion    Hip extension    Hip abduction    Hip adduction    Hip internal rotation    Hip external rotation    Knee flexion    Knee extension    Ankle dorsiflexion    Ankle plantarflexion    Ankle inversion    Ankle eversion     (Blank rows = not tested)  LOWER EXTREMITY SPECIAL TESTS:  Not tested due to surgical condition  FUNCTIONAL  TESTS:  Timed up and go (TUG): 13.89 seconds with RW; 11.01 seconds without RW  2 minute walk test: 310 feet with RW   GAIT: Distance walked: 310 feet Assistive device utilized: Walker - 2 wheeled Level of assistance: Modified independence Comments: decreased gait speed and stride length; she was able to ambulate short distances without an assistive device  TREATMENT DATE:                                     12/28/23 EXERCISE LOG  Exercise Repetitions and Resistance Comments  Nustep  L3 x 7 minutes    Lunges onto step   15 reps each    Seated hip ABD isometric   2 minutes w/ 5 second hold    Seated HS stretch   4 x 30 seconds  RLE only   Double knee to chest   3 minutes  LE supported on green ball   R piriformis stretch  4 x 30 seconds        Blank cell = exercise not performed today                                    12/24/23 EXERCISE LOG  Exercise Repetitions and Resistance Comments  Nustep  L1 x 8 minutes    Squatting   20 reps  Without UE support   Stairs   5 x 4 steps  With reciprocal pattern; without UE support   Goal assessment  See below   Stepping over hurdles   5 laps x 20 feet  Various heights   Sit to stand  15 reps  With tidal tank at chest   Walking with tidal tank   1 lap around gym     Blank cell = exercise not performed today                                     12/22/23 EXERCISE LOG  Exercise Repetitions and Resistance Comments  Nustep  L3 x 7 minutes LE only   Rocker board  2 minutes  With BUE support from parallel bars   Static stance on foam   2 minutes  Without UE support   Marching on foam  2 minutes  Without BUE support from parallel bars   Side stepping over hurdles  3 laps without hurdles  5 laps with hurdles  Without parallel bars   Squatting  15 reps  With parallel bars   Stairs  4 step progressing from step-to to  reciprocal pattern then moving to 6 steps progressing from step-to to reciprocal pattern    2 MWT 339 feet  Without AD   Lunges onto step  6 step x 10 reps w/ 10 second hold  Left foot on step   Blank cell = exercise not performed today   12/17/23: Reviewed goals Educated importance of HEP compliance for maximal benefits Gait training: heel to toe mechanics x 49ft with RW Attempted clam- unable Supine: Abduction slide Isometric abduction with belt Isometric adduction with ball squeeze Bridge 10x  Seated:  STS no HHA 10x Gastroc stretch shown in standing- doesn't feel stable so completed in seated with strap  PATIENT EDUCATION:  Education details: healing, HEP, and expectations for soreness  Person educated: Patient Education method: Medical Illustrator Education comprehension: verbalized understanding and returned demonstration  HOME EXERCISE PROGRAM: Reviewed physician prescribed HEP (patient exhibited good recall)  Access Code: [email or web address] URL: [email or web address] Date: 12/17/2023 Prepared by: Augustin Mclean  Exercises - Hooklying Isometric Hip Abduction Adduction with Belt and Ball  - 1 x daily - 7 x weekly - 3 sets -  10 reps - Supine Bridge  - 1 x daily - 7 x weekly - 3 sets - 10 reps - Standing Hip Abduction with Counter Support  - 1 x daily - 7 x weekly - 3 sets - 10 reps - Standing Hip Extension with Counter Support  - 1 x daily - 7 x weekly - 3 sets - 10 reps - Sit to Stand  - 2 x daily - 7 x weekly - 3 sets - 10 reps - Seated Gastroc Stretch with Strap  - 2 x daily - 7 x weekly - 1 sets - 3 reps - 30 hold  ASSESSMENT:  CLINICAL IMPRESSION: Patient was introduced to new multiple new interventions for isometric muscular engagement and soft tissue extensibility following her prolonged soreness after her last appointment. She required minimal cueing with today's new interventions for proper exercise performance. She was educated on how to  complete these interventions at home as she noted improved mobility and reduced symptoms with today's new interventions. She reported feeling a lot better upon the conclusion of treatment. Patient continues to require skilled physical therapy to address her remaining impairments to return to her prior level of function.    Eval:  Patient is a 58 y.o. female who was seen today for physical therapy evaluation and treatment following a right total hip arthroplasty with a posterior approach on 12/09/23. She presented with low pain severity and irritability with none of today's assessments significantly reproducing her familiar symptoms. Lower extremity manual muscle testing was held at this time due to her surgical condition. She exhibited no signs or symptoms of a post-operative complication. Her HEP was reviewed and she was able to exhibit good recall of these interventions. Recommend that she continue with skilled physical therapy to address her impairments to return to her prior level of function.    OBJECTIVE IMPAIRMENTS: Abnormal gait, decreased activity tolerance, decreased mobility, difficulty walking, decreased ROM, decreased strength, impaired tone, and pain.   ACTIVITY LIMITATIONS: lifting, standing, squatting, stairs, and locomotion level  PARTICIPATION LIMITATIONS: meal prep, cleaning, laundry, shopping, community activity, and occupation  PERSONAL FACTORS: 3+ comorbidities: ADHD, arthritis, HTN, Graves Disease, and a history of anxiety and depression are also affecting patient's functional outcome.   REHAB POTENTIAL: Excellent  CLINICAL DECISION MAKING: Stable/uncomplicated  EVALUATION COMPLEXITY: Low   GOALS: Goals reviewed with patient? Yes  SHORT TERM GOALS: Target date: 01/04/24 Patient will be independent with her initial HEP.  Baseline: Goal status: MET  2.  Patient will be able to ambulate at least 100 feet without an assistive device for improved household mobility.   Baseline:  Goal status: MET  3.  Patient will improve her right hip flexion to at least 110 degrees for improved function squatting.  Baseline: 104 degrees on 12/24/23 Goal status: ON GOING  LONG TERM GOALS: Target date: 01/25/24  Patient will improve her LEFS score to at least 50/80 for improved perceived function with her daily activities.  Baseline: 12/24/23: 44/80 Goal status: ON GOING  2.  Patient will be able to navigate at least 4 steps with a reciprocal pattern for improved household mobility.  Baseline: able to complete without UE support  Goal status: MET  3.  Patient will report being able to return to her previous gym routine without being limited by her right hip symptoms to safely return to her prior level of function.  Baseline:  Goal status: ON GOING   4.  Patient will be able to ambulate without an assistive  device with no significant gait deviations for improved community mobility.  Baseline:  Goal status: ON GOING  5.  Patient will improve her by at least 50 feet for improved community mobility.  Baseline: 29 feet on 12/22/23 Goal status: ON GOING  PLAN:  PT FREQUENCY: 2x/week  PT DURATION: 6 weeks  PLANNED INTERVENTIONS: 97164- PT Re-evaluation, 97750- Physical Performance Testing, 97110-Therapeutic exercises, 97530- Therapeutic activity, 97112- Neuromuscular re-education, 97535- Self Care, [id number]- Manual therapy, [phone]- Gait training, Patient/Family education, Balance training, Stair training, Joint mobilization, Cryotherapy, and Moist heat  PLAN FOR NEXT SESSION: update HEP (as needed), lower extremity strengthening, and gait training.  Add rocker board and heel/ toe raises next session.   Lacinda JAYSON Fass, PT 12/28/2023, 8:19 AM

## 2024-01-01 ENCOUNTER — Ambulatory Visit (HOSPITAL_COMMUNITY)

## 2024-01-01 ENCOUNTER — Encounter (HOSPITAL_COMMUNITY): Payer: Self-pay

## 2024-01-01 DIAGNOSIS — M25551 Pain in right hip: Secondary | ICD-10-CM

## 2024-01-01 DIAGNOSIS — Z96641 Presence of right artificial hip joint: Secondary | ICD-10-CM

## 2024-01-01 DIAGNOSIS — R2689 Other abnormalities of gait and mobility: Secondary | ICD-10-CM

## 2024-01-01 NOTE — Therapy (Signed)
 OUTPATIENT PHYSICAL THERAPY LOWER EXTREMITY TREATMENT   Patient Name: [name] MRN: [id number] DOB:[birth year], 58 y.o., female Today's Date: 01/01/2024  END OF SESSION:  PT End of Session - 01/01/24 0731     Visit Number 6    Number of Visits 12    Date for Recertification  03/11/24    Authorization Type [employer]    Authorization Time Period no authorizatoin required    Progress Note Due on Visit 10    PT Start Time 0731    PT Stop Time 0812    PT Time Calculation (min) 41 min    Activity Tolerance Patient tolerated treatment well    Behavior During Therapy WFL for tasks assessed/performed            Past Medical History:  Diagnosis Date   ADHD (attention deficit hyperactivity disorder)    Anxiety    Arthritis    Autoimmune hepatitis (HCC)    Depression    Hypertension    Sleep apnea    does not use cpap very often per pt   Past Surgical History:  Procedure Laterality Date   ABDOMINAL HYSTERECTOMY     benign papilloma removed from right breast      CHOLECYSTECTOMY     KNEE SURGERY Bilateral    nerve root compression     TOTAL HIP ARTHROPLASTY Right 12/09/2023   Procedure: ARTHROPLASTY, HIP, TOTAL,POSTERIOR APPROACH;  Surgeon: Edna Toribio LABOR, MD;  Location: [facility];  Service: Orthopedics;  Laterality: Right;   There are no active problems to display for this patient.   PCP: Samie Frederick, PA-C  REFERRING PROVIDER: Edna Toribio LABOR, MD   REFERRING DIAG: post op right total hip arthroplasty   THERAPY DIAG:  Pain in right hip  Other abnormalities of gait and mobility  Presence of right artificial hip joint  Rationale for Evaluation and Treatment: Rehabilitation  ONSET DATE: 12/09/23  SUBJECTIVE:   SUBJECTIVE STATEMENT: Patient reports that she was really sore after her last appointment. She notes that her surgeon is happy with her progress and she was cleared to drive. Her soreness got up to 8/10 on Friday. She was not given any  precautions or restrictions when she saw her surgeon. Pt reports she is feeling good today, no reports of pain.  Has returned to some ADLs with increased ease.  Able to vacuum yesterday without difficulty.  Eval:  Patient reports that she had a right total hip replacement on 12/09/23. She feels that it has been going well since surgery. She has begun to wean herself off her pain medication. She has been using a walker to get around. She was told not to bend at her waist prior to surgery. However, she was told by the PT in the hospital that she can bend over as long as it doesn't hurt. She feels that her constipation is her biggest problem right now. She is walking a little right now, but she wanted to start PT prior to walking more. She was given a HEP and she has been doing these for 10 reps twice per day.   PERTINENT HISTORY: ADHD, arthritis, HTN, Graves Disease, and a history of anxiety and depression PAIN:  Are you having pain? Yes: NPRS scale: Current: 5/10 Best: 0/10 Worst: 4/10 Pain location: right lateral hip  Pain description: soreness Aggravating factors: pressure, any twisting Relieving factors: ice  PRECAUTIONS: Posterior hip  RED FLAGS: None   WEIGHT BEARING RESTRICTIONS: No  FALLS:  Has patient fallen in  last 6 months? No  LIVING ENVIRONMENT: Lives with: lives with their spouse Lives in: House/apartment Stairs: Yes: External: 4 steps; can reach both; going up the the left leg and down with the right leg first Has following equipment at home: Vannie - 2 wheeled  OCCUPATION: Psychiatric Nurse in [employer]  PLOF: Independent  PATIENT GOALS: be able to walk longer without pain (several miles prior to surgery)and go back to the gym  NEXT MD VISIT: 01/21/24  OBJECTIVE:  Note: Objective measures were completed at Evaluation unless otherwise noted.  DIAGNOSTIC FINDINGS: 12/09/23 Right hip x-ray IMPRESSION: Right hip arthroplasty without immediate postoperative  complication.  PATIENT SURVEYS:  LEFS  Extreme difficulty/unable (0), Quite a bit of difficulty (1), Moderate difficulty (2), Little difficulty (3), No difficulty (4) Survey date:  12/14/23 12/24/23  Any of your usual work, housework or school activities 2 3  2. Usual hobbies, recreational or sporting activities 0 4  3. Getting into/out of the bath 4 4  4. Walking between rooms 4 4  5. Putting on socks/shoes 0 1  6. Squatting  0 2  7. Lifting an object, like a bag of groceries from the floor 0 3  8. Performing light activities around your home 4 4  9. Performing heavy activities around your home 0 0  10. Getting into/out of a car 4 4  11. Walking 2 blocks 2 4  12. Walking 1 mile 0 0  13. Going up/down 10 stairs (1 flight) 3 3  14. Standing for 1 hour 0 0  15.  sitting for 1 hour 4 4  16. Running on even ground 0 0  17. Running on uneven ground 0 0  18. Making sharp turns while running fast 0 0  19. Hopping  0 0  20. Rolling over in bed 4 4  Score total:  31/80 44/80     COGNITION: Overall cognitive status: Within functional limits for tasks assessed     SENSATION: Patient reports no numbness or tingling  EDEMA:  Mild right hip edema   POSTURE: No Significant postural limitations  PALPATION: TTP: right hip flexor  LOWER EXTREMITY ROM:  Active ROM Right eval Left eval  Hip flexion 96; tight 116  Hip extension    Hip abduction    Hip adduction    Hip internal rotation    Hip external rotation    Knee flexion 126 112  Knee extension 3; pulling in thigh 0  Ankle dorsiflexion    Ankle plantarflexion    Ankle inversion    Ankle eversion     (Blank rows = not tested)  LOWER EXTREMITY MMT: not tested due to surgical condition  MMT Right eval Left eval  Hip flexion    Hip extension    Hip abduction    Hip adduction    Hip internal rotation    Hip external rotation    Knee flexion    Knee extension    Ankle dorsiflexion    Ankle plantarflexion     Ankle inversion    Ankle eversion     (Blank rows = not tested)  LOWER EXTREMITY SPECIAL TESTS:  Not tested due to surgical condition  FUNCTIONAL TESTS:  Timed up and go (TUG): 13.89 seconds with RW; 11.01 seconds without RW  2 minute walk test: 310 feet with RW   GAIT: Distance walked: 310 feet Assistive device utilized: Walker - 2 wheeled Level of assistance: Modified independence Comments: decreased gait speed and stride  length; she was able to ambulate short distances without an assistive device                                                                                                                                TREATMENT DATE:   01/01/24: Rockerboard Rt/Lt x 2 min Treadmill Grade 3.0 1.5 x 1', 1.8 x1', 2.0 x 3' Discussed benefits of walking program Squat 20x Sidelying: Rt LE abduction 10x 7in step reciprocal pattern 5RT 7in Rt LE lateral step up 10 Prone: quad stretch with rope 2x 30 Seated: piriformis stretch 2x 30 Standing: hip flexor stretch on 2nd step (14in height) 2x 30 Tandem stance 1x 30 on floor, 1x 30 foam                                   12/28/23 EXERCISE LOG  Exercise Repetitions and Resistance Comments  Nustep  L3 x 7 minutes    Lunges onto step   15 reps each    Seated hip ABD isometric   2 minutes w/ 5 second hold    Seated HS stretch   4 x 30 seconds  RLE only   Double knee to chest   3 minutes  LE supported on green ball   R piriformis stretch  4 x 30 seconds        Blank cell = exercise not performed today                                    12/24/23 EXERCISE LOG  Exercise Repetitions and Resistance Comments  Nustep  L1 x 8 minutes    Squatting   20 reps  Without UE support   Stairs   5 x 4 steps  With reciprocal pattern; without UE support   Goal assessment  See below   Stepping over hurdles   5 laps x 20 feet  Various heights   Sit to stand  15 reps  With tidal tank at chest   Walking with tidal tank   1 lap around gym      Blank cell = exercise not performed today                                     12/22/23 EXERCISE LOG  Exercise Repetitions and Resistance Comments  Nustep  L3 x 7 minutes LE only   Rocker board  2 minutes  With BUE support from parallel bars   Static stance on foam   2 minutes  Without UE support   Marching on foam  2 minutes  Without BUE support from parallel bars   Side stepping over hurdles  3 laps without hurdles  5 laps with hurdles  Without parallel bars   Squatting  15 reps  With parallel bars   Stairs  4 step progressing from step-to to reciprocal pattern then moving to 6 steps progressing from step-to to reciprocal pattern    2 MWT 339 feet  Without AD   Lunges onto step  6 step x 10 reps w/ 10 second hold  Left foot on step   Blank cell = exercise not performed today   12/17/23: Reviewed goals Educated importance of HEP compliance for maximal benefits Gait training: heel to toe mechanics x 68ft with RW Attempted clam- unable Supine: Abduction slide Isometric abduction with belt Isometric adduction with ball squeeze Bridge 10x  Seated:  STS no HHA 10x Gastroc stretch shown in standing- doesn't feel stable so completed in seated with strap  PATIENT EDUCATION:  Education details: healing, HEP, and expectations for soreness  Person educated: Patient Education method: Medical Illustrator Education comprehension: verbalized understanding and returned demonstration  HOME EXERCISE PROGRAM: Reviewed physician prescribed HEP (patient exhibited good recall)  Access Code: [email or web address] URL: [email or web address] Date: 12/17/2023 Prepared by: Augustin Mclean  Exercises - Hooklying Isometric Hip Abduction Adduction with Belt and Ball  - 1 x daily - 7 x weekly - 3 sets - 10 reps - Supine Bridge  - 1 x daily - 7 x weekly - 3 sets - 10 reps - Standing Hip Abduction with Counter Support  - 1 x daily - 7 x weekly - 3 sets - 10 reps - Standing Hip  Extension with Counter Support  - 1 x daily - 7 x weekly - 3 sets - 10 reps - Sit to Stand  - 2 x daily - 7 x weekly - 3 sets - 10 reps - Seated Gastroc Stretch with Strap  - 2 x daily - 7 x weekly - 1 sets - 3 reps - 30 hold  01/01/24: - Prone Quadriceps Stretch with Strap  - 2 x daily - 7 x weekly - 1 sets - 3 reps - 30 hold - Seated Piriformis Stretch  - 2 x daily - 7 x weekly - 1 sets - 3 reps - 30 hold - Hip Flexor Stretch on Step  - 2 x daily - 7 x weekly - 1 sets - 3 reps - 30 hold  ASSESSMENT:  CLINICAL IMPRESSION: Pt arrived with slight antalgic gait mechanics and stated she feels Rt LE is longer.  Pt educated this is normal following hip replacement, encouraged to wait several months and to then look into slight heel lift to equalize leg length.  Added rockerboard to equalize weight bearing with gait and began walking on treadmill with min cueing to improve gait mechanics.  Pt with heavy breathing following treadmill with short duration rest break.  Discussed benefits of beginning walking program to improve activity tolerance.  Progress functional strengthening and stair training and squats with good mechaincs.  Added stretches for mobility to address soreness following therapy and given HEP handout with the stretches to continue at home.  Pt tolerated well to session with no reports of increased pain, was limited by fatigue at appropriate levels.  Eval:  Patient is a 58 y.o. female who was seen today for physical therapy evaluation and treatment following a right total hip arthroplasty with a posterior approach on 12/09/23. She presented with low pain severity and irritability with none of today's assessments significantly reproducing her familiar symptoms. Lower extremity manual muscle testing was held at this time due to her surgical condition. She  exhibited no signs or symptoms of a post-operative complication. Her HEP was reviewed and she was able to exhibit good recall of these  interventions. Recommend that she continue with skilled physical therapy to address her impairments to return to her prior level of function.    OBJECTIVE IMPAIRMENTS: Abnormal gait, decreased activity tolerance, decreased mobility, difficulty walking, decreased ROM, decreased strength, impaired tone, and pain.   ACTIVITY LIMITATIONS: lifting, standing, squatting, stairs, and locomotion level  PARTICIPATION LIMITATIONS: meal prep, cleaning, laundry, shopping, community activity, and occupation  PERSONAL FACTORS: 3+ comorbidities: ADHD, arthritis, HTN, Graves Disease, and a history of anxiety and depression are also affecting patient's functional outcome.   REHAB POTENTIAL: Excellent  CLINICAL DECISION MAKING: Stable/uncomplicated  EVALUATION COMPLEXITY: Low   GOALS: Goals reviewed with patient? Yes  SHORT TERM GOALS: Target date: 01/04/24 Patient will be independent with her initial HEP.  Baseline: Goal status: MET  2.  Patient will be able to ambulate at least 100 feet without an assistive device for improved household mobility.  Baseline:  Goal status: MET  3.  Patient will improve her right hip flexion to at least 110 degrees for improved function squatting.  Baseline: 104 degrees on 12/24/23 Goal status: ON GOING  LONG TERM GOALS: Target date: 01/25/24  Patient will improve her LEFS score to at least 50/80 for improved perceived function with her daily activities.  Baseline: 12/24/23: 44/80 Goal status: ON GOING  2.  Patient will be able to navigate at least 4 steps with a reciprocal pattern for improved household mobility.  Baseline: able to complete without UE support  Goal status: MET  3.  Patient will report being able to return to her previous gym routine without being limited by her right hip symptoms to safely return to her prior level of function.  Baseline:  Goal status: ON GOING   4.  Patient will be able to ambulate without an assistive device with no  significant gait deviations for improved community mobility.  Baseline:  Goal status: ON GOING  5.  Patient will improve her by at least 50 feet for improved community mobility.  Baseline: 29 feet on 12/22/23 Goal status: ON GOING  PLAN:  PT FREQUENCY: 2x/week  PT DURATION: 6 weeks  PLANNED INTERVENTIONS: 97164- PT Re-evaluation, 97750- Physical Performance Testing, 97110-Therapeutic exercises, 97530- Therapeutic activity, 97112- Neuromuscular re-education, 97535- Self Care, [id number]- Manual therapy, [phone]- Gait training, Patient/Family education, Balance training, Stair training, Joint mobilization, Cryotherapy, and Moist heat  PLAN FOR NEXT SESSION: update HEP (as needed), lower extremity strengthening, and gait training.  Continue gait training on treadmill, gluteal strengthening and balance activities.  Augustin Mclean, LPTA/CLT; CBIS [phone]  Mclean Augustin Amble, PTA 01/01/2024, 5:42 PM

## 2024-01-04 ENCOUNTER — Encounter (HOSPITAL_COMMUNITY)

## 2024-01-06 ENCOUNTER — Encounter (HOSPITAL_COMMUNITY)

## 2024-01-11 ENCOUNTER — Ambulatory Visit (HOSPITAL_COMMUNITY)

## 2024-01-11 ENCOUNTER — Encounter (HOSPITAL_COMMUNITY): Payer: Self-pay

## 2024-01-11 DIAGNOSIS — M25551 Pain in right hip: Secondary | ICD-10-CM | POA: Diagnosis present

## 2024-01-11 DIAGNOSIS — R2689 Other abnormalities of gait and mobility: Secondary | ICD-10-CM | POA: Insufficient documentation

## 2024-01-11 DIAGNOSIS — Z96641 Presence of right artificial hip joint: Secondary | ICD-10-CM | POA: Insufficient documentation

## 2024-01-11 NOTE — Therapy (Signed)
 OUTPATIENT PHYSICAL THERAPY LOWER EXTREMITY TREATMENT   Patient Name: [name] MRN: [id number] DOB:28-Sep-1965, 58 y.o., female Today's Date: 01/11/2024  END OF SESSION:  PT End of Session - 01/11/24 0737     Visit Number 7    Number of Visits 12    Date for Recertification  03/11/24    Authorization Type [employer]    Authorization Time Period no authorizatoin required    Progress Note Due on Visit 10    PT Start Time [id number]   Patient arrived late to her appointment.   PT Stop Time 0813    PT Time Calculation (min) 36 min    Activity Tolerance Patient tolerated treatment well    Behavior During Therapy WFL for tasks assessed/performed             Past Medical History:  Diagnosis Date   ADHD (attention deficit hyperactivity disorder)    Anxiety    Arthritis    Autoimmune hepatitis (HCC)    Depression    Hypertension    Sleep apnea    does not use cpap very often per pt   Past Surgical History:  Procedure Laterality Date   ABDOMINAL HYSTERECTOMY     benign papilloma removed from right breast      CHOLECYSTECTOMY     KNEE SURGERY Bilateral    nerve root compression     TOTAL HIP ARTHROPLASTY Right 12/09/2023   Procedure: ARTHROPLASTY, HIP, TOTAL,POSTERIOR APPROACH;  Surgeon: Edna Toribio LABOR, MD;  Location: [facility];  Service: Orthopedics;  Laterality: Right;   There are no active problems to display for this patient.   PCP: Samie Frederick, PA-C  REFERRING PROVIDER: Edna Toribio LABOR, MD   REFERRING DIAG: post op right total hip arthroplasty   THERAPY DIAG:  Pain in right hip  Other abnormalities of gait and mobility  Presence of right artificial hip joint  Rationale for Evaluation and Treatment: Rehabilitation  ONSET DATE: 12/09/23  SUBJECTIVE:   SUBJECTIVE STATEMENT: Patient reports that she is a little sore. She feels a little out of balance as she feels that one leg is a little longer. She can tell that her balance is still a little off.  She fell last week while walking down the road. She accidentally stepped off the road and landed on her knees. She is not injured and did not need to go to the hospital.   Eval:  Patient reports that she had a right total hip replacement on 12/09/23. She feels that it has been going well since surgery. She has begun to wean herself off her pain medication. She has been using a walker to get around. She was told not to bend at her waist prior to surgery. However, she was told by the PT in the hospital that she can bend over as long as it doesn't hurt. She feels that her constipation is her biggest problem right now. She is walking a little right now, but she wanted to start PT prior to walking more. She was given a HEP and she has been doing these for 10 reps twice per day.   PERTINENT HISTORY: ADHD, arthritis, HTN, Graves Disease, and a history of anxiety and depression PAIN:  Are you having pain? Yes: NPRS scale: Current: 1/10 Best: 0/10 Worst: 4/10 Pain location: right lateral hip  Pain description: soreness Aggravating factors: pressure, any twisting Relieving factors: ice  PRECAUTIONS: Posterior hip  RED FLAGS: None   WEIGHT BEARING RESTRICTIONS: No  FALLS:  Has  patient fallen in last 6 months? No  LIVING ENVIRONMENT: Lives with: lives with their spouse Lives in: House/apartment Stairs: Yes: External: 4 steps; can reach both; going up the the left leg and down with the right leg first Has following equipment at home: Vannie - 2 wheeled  OCCUPATION: Psychiatric Nurse in [employer]  PLOF: Independent  PATIENT GOALS: be able to walk longer without pain (several miles prior to surgery)and go back to the gym  NEXT MD VISIT: 01/21/24  OBJECTIVE:  Note: Objective measures were completed at Evaluation unless otherwise noted.  DIAGNOSTIC FINDINGS: 12/09/23 Right hip x-ray IMPRESSION: Right hip arthroplasty without immediate postoperative complication.  PATIENT SURVEYS:   LEFS  Extreme difficulty/unable (0), Quite a bit of difficulty (1), Moderate difficulty (2), Little difficulty (3), No difficulty (4) Survey date:  12/14/23 12/24/23  Any of your usual work, housework or school activities 2 3  2. Usual hobbies, recreational or sporting activities 0 4  3. Getting into/out of the bath 4 4  4. Walking between rooms 4 4  5. Putting on socks/shoes 0 1  6. Squatting  0 2  7. Lifting an object, like a bag of groceries from the floor 0 3  8. Performing light activities around your home 4 4  9. Performing heavy activities around your home 0 0  10. Getting into/out of a car 4 4  11. Walking 2 blocks 2 4  12. Walking 1 mile 0 0  13. Going up/down 10 stairs (1 flight) 3 3  14. Standing for 1 hour 0 0  15.  sitting for 1 hour 4 4  16. Running on even ground 0 0  17. Running on uneven ground 0 0  18. Making sharp turns while running fast 0 0  19. Hopping  0 0  20. Rolling over in bed 4 4  Score total:  31/80 44/80     COGNITION: Overall cognitive status: Within functional limits for tasks assessed     SENSATION: Patient reports no numbness or tingling  EDEMA:  Mild right hip edema   POSTURE: No Significant postural limitations  PALPATION: TTP: right hip flexor  LOWER EXTREMITY ROM:  Active ROM Right eval Left eval  Hip flexion 96; tight 116  Hip extension    Hip abduction    Hip adduction    Hip internal rotation    Hip external rotation    Knee flexion 126 112  Knee extension 3; pulling in thigh 0  Ankle dorsiflexion    Ankle plantarflexion    Ankle inversion    Ankle eversion     (Blank rows = not tested)  LOWER EXTREMITY MMT: not tested due to surgical condition  MMT Right eval Left eval  Hip flexion    Hip extension    Hip abduction    Hip adduction    Hip internal rotation    Hip external rotation    Knee flexion    Knee extension    Ankle dorsiflexion    Ankle plantarflexion    Ankle inversion    Ankle eversion      (Blank rows = not tested)  LOWER EXTREMITY SPECIAL TESTS:  Not tested due to surgical condition  FUNCTIONAL TESTS:  Timed up and go (TUG): 13.89 seconds with RW; 11.01 seconds without RW  2 minute walk test: 310 feet with RW   GAIT: Distance walked: 310 feet Assistive device utilized: Walker - 2 wheeled Level of assistance: Modified independence Comments: decreased gait  speed and stride length; she was able to ambulate short distances without an assistive device                                                                                                                                TREATMENT DATE:                                     01/11/24 EXERCISE LOG  Exercise Repetitions and Resistance Comments  treadmill 1.8 mph @ 2% incline x 5 minutes    Rocker board   2 minutes each  AP and lateral; intermittent UE support   Tandem stance on foam  3 x 30 seconds each  Intermittent UE support from parallel bars   Lunges onto BOSU   2 x 1 minute each  Ball up; intermittent UE support from parallel bars   Marching on BOSU  2 minutes  Ball up; BUE support from parallel bars   Sit to stand  20 reps  With tidal tank at chest   Blank cell = exercise not performed today   01/01/24: Rockerboard Rt/Lt x 2 min Treadmill Grade 3.0 1.5 x 1', 1.8 x1', 2.0 x 3' Discussed benefits of walking program Squat 20x Sidelying: Rt LE abduction 10x 7in step reciprocal pattern 5RT 7in Rt LE lateral step up 10 Prone: quad stretch with rope 2x 30 Seated: piriformis stretch 2x 30 Standing: hip flexor stretch on 2nd step (14in height) 2x 30 Tandem stance 1x 30 on floor, 1x 30 foam                                   12/28/23 EXERCISE LOG  Exercise Repetitions and Resistance Comments  Nustep  L3 x 7 minutes    Lunges onto step   15 reps each    Seated hip ABD isometric   2 minutes w/ 5 second hold    Seated HS stretch   4 x 30 seconds  RLE only   Double knee to chest   3 minutes  LE supported  on green ball   R piriformis stretch  4 x 30 seconds        Blank cell = exercise not performed today                                    12/24/23 EXERCISE LOG  Exercise Repetitions and Resistance Comments  Nustep  L1 x 8 minutes    Squatting   20 reps  Without UE support   Stairs   5 x 4 steps  With reciprocal pattern; without UE support   Goal assessment  See below   Stepping over hurdles   5 laps x 20 feet  Various heights   Sit to stand  15 reps  With tidal tank at chest   Walking with tidal tank   1 lap around gym     Blank cell = exercise not performed today   PATIENT EDUCATION:  Education details: HEP progression  Person educated: Patient Education method: Medical Illustrator Education comprehension: verbalized understanding and returned demonstration  HOME EXERCISE PROGRAM: Reviewed physician prescribed HEP (patient exhibited good recall)  Access Code: [email or web address] URL: [email or web address] Date: 12/17/2023 Prepared by: Augustin Mclean  Exercises - Hooklying Isometric Hip Abduction Adduction with Belt and Ball  - 1 x daily - 7 x weekly - 3 sets - 10 reps - Supine Bridge  - 1 x daily - 7 x weekly - 3 sets - 10 reps - Standing Hip Abduction with Counter Support  - 1 x daily - 7 x weekly - 3 sets - 10 reps - Standing Hip Extension with Counter Support  - 1 x daily - 7 x weekly - 3 sets - 10 reps - Sit to Stand  - 2 x daily - 7 x weekly - 3 sets - 10 reps - Seated Gastroc Stretch with Strap  - 2 x daily - 7 x weekly - 1 sets - 3 reps - 30 hold  01/01/24: - Prone Quadriceps Stretch with Strap  - 2 x daily - 7 x weekly - 1 sets - 3 reps - 30 hold - Seated Piriformis Stretch  - 2 x daily - 7 x weekly - 1 sets - 3 reps - 30 hold - Hip Flexor Stretch on Step  - 2 x daily - 7 x weekly - 1 sets - 3 reps - 30 hold  ASSESSMENT:  CLINICAL IMPRESSION: Today's treatment focused on new and familiar interventions for improved lower extremity stability with  moderate difficulty and fatigue. She required minimal cueing with lunges onto the BOSU for proper biomechanics. She experienced a moderate increase in fatigue with today's interventions, but this did not inhibit her ability to complete any of today's interventions. She reported feeling sore upon the conclusion of treatment. Patient continues to require skilled physical therapy to address her remaining impairments to return to her prior level of function.     Eval:  Patient is a 58 y.o. female who was seen today for physical therapy evaluation and treatment following a right total hip arthroplasty with a posterior approach on 12/09/23. She presented with low pain severity and irritability with none of today's assessments significantly reproducing her familiar symptoms. Lower extremity manual muscle testing was held at this time due to her surgical condition. She exhibited no signs or symptoms of a post-operative complication. Her HEP was reviewed and she was able to exhibit good recall of these interventions. Recommend that she continue with skilled physical therapy to address her impairments to return to her prior level of function.    OBJECTIVE IMPAIRMENTS: Abnormal gait, decreased activity tolerance, decreased mobility, difficulty walking, decreased ROM, decreased strength, impaired tone, and pain.   ACTIVITY LIMITATIONS: lifting, standing, squatting, stairs, and locomotion level  PARTICIPATION LIMITATIONS: meal prep, cleaning, laundry, shopping, community activity, and occupation  PERSONAL FACTORS: 3+ comorbidities: ADHD, arthritis, HTN, Graves Disease, and a history of anxiety and depression are also affecting patient's functional outcome.   REHAB POTENTIAL: Excellent  CLINICAL DECISION MAKING: Stable/uncomplicated  EVALUATION COMPLEXITY: Low   GOALS: Goals reviewed with patient? Yes  SHORT TERM GOALS: Target date: 01/04/24 Patient will be independent with her initial HEP.   Baseline:  Goal status: MET  2.  Patient will be able to ambulate at least 100 feet without an assistive device for improved household mobility.  Baseline:  Goal status: MET  3.  Patient will improve her right hip flexion to at least 110 degrees for improved function squatting.  Baseline: 104 degrees on 12/24/23 Goal status: ON GOING  LONG TERM GOALS: Target date: 01/25/24  Patient will improve her LEFS score to at least 50/80 for improved perceived function with her daily activities.  Baseline: 12/24/23: 44/80 Goal status: ON GOING  2.  Patient will be able to navigate at least 4 steps with a reciprocal pattern for improved household mobility.  Baseline: able to complete without UE support  Goal status: MET  3.  Patient will report being able to return to her previous gym routine without being limited by her right hip symptoms to safely return to her prior level of function.  Baseline:  Goal status: ON GOING   4.  Patient will be able to ambulate without an assistive device with no significant gait deviations for improved community mobility.  Baseline:  Goal status: ON GOING  5.  Patient will improve her by at least 50 feet for improved community mobility.  Baseline: 29 feet on 12/22/23 Goal status: ON GOING  PLAN:  PT FREQUENCY: 2x/week  PT DURATION: 6 weeks  PLANNED INTERVENTIONS: 97164- PT Re-evaluation, 97750- Physical Performance Testing, 97110-Therapeutic exercises, 97530- Therapeutic activity, 97112- Neuromuscular re-education, 97535- Self Care, [id number]- Manual therapy, [phone]- Gait training, Patient/Family education, Balance training, Stair training, Joint mobilization, Cryotherapy, and Moist heat  PLAN FOR NEXT SESSION: update HEP (as needed), lower extremity strengthening, and gait training.  Continue gait training on treadmill, gluteal strengthening and balance activities.  Lacinda JAYSON Fass, PT 01/11/2024, 12:24 PM

## 2024-01-14 ENCOUNTER — Encounter (HOSPITAL_COMMUNITY): Payer: Self-pay

## 2024-01-14 ENCOUNTER — Ambulatory Visit (HOSPITAL_COMMUNITY)

## 2024-01-14 DIAGNOSIS — M25551 Pain in right hip: Secondary | ICD-10-CM | POA: Diagnosis not present

## 2024-01-14 DIAGNOSIS — Z96641 Presence of right artificial hip joint: Secondary | ICD-10-CM

## 2024-01-14 DIAGNOSIS — R2689 Other abnormalities of gait and mobility: Secondary | ICD-10-CM

## 2024-01-14 NOTE — Therapy (Signed)
 OUTPATIENT PHYSICAL THERAPY LOWER EXTREMITY TREATMENT   Patient Name: [name] MRN: [id number] DOB:1965-07-06, 58 y.o., female Today's Date: 01/14/2024  END OF SESSION:  PT End of Session - 01/14/24 0732     Visit Number 8    Number of Visits 12    Date for Recertification  03/11/24    Authorization Type [employer]    Authorization Time Period no authorizatoin required    Progress Note Due on Visit 10    PT Start Time 0732    PT Stop Time 0813    PT Time Calculation (min) 41 min    Activity Tolerance Patient tolerated treatment well    Behavior During Therapy WFL for tasks assessed/performed              Past Medical History:  Diagnosis Date   ADHD (attention deficit hyperactivity disorder)    Anxiety    Arthritis    Autoimmune hepatitis (HCC)    Depression    Hypertension    Sleep apnea    does not use cpap very often per pt   Past Surgical History:  Procedure Laterality Date   ABDOMINAL HYSTERECTOMY     benign papilloma removed from right breast      CHOLECYSTECTOMY     KNEE SURGERY Bilateral    nerve root compression     TOTAL HIP ARTHROPLASTY Right 12/09/2023   Procedure: ARTHROPLASTY, HIP, TOTAL,POSTERIOR APPROACH;  Surgeon: Edna Toribio LABOR, MD;  Location: [facility];  Service: Orthopedics;  Laterality: Right;   There are no active problems to display for this patient.   PCP: Samie Frederick, PA-C  REFERRING PROVIDER: Edna Toribio LABOR, MD   REFERRING DIAG: post op right total hip arthroplasty   THERAPY DIAG:  Pain in right hip  Other abnormalities of gait and mobility  Presence of right artificial hip joint  Rationale for Evaluation and Treatment: Rehabilitation  ONSET DATE: 12/09/23  SUBJECTIVE:   SUBJECTIVE STATEMENT: Patient reports that she is not hurting today, but she is stiff as she has not walked much yet. She got a heel lift for her shoes and it seems to be helping.   Eval:  Patient reports that she had a right total hip  replacement on 12/09/23. She feels that it has been going well since surgery. She has begun to wean herself off her pain medication. She has been using a walker to get around. She was told not to bend at her waist prior to surgery. However, she was told by the PT in the hospital that she can bend over as long as it doesn't hurt. She feels that her constipation is her biggest problem right now. She is walking a little right now, but she wanted to start PT prior to walking more. She was given a HEP and she has been doing these for 10 reps twice per day.   PERTINENT HISTORY: ADHD, arthritis, HTN, Graves Disease, and a history of anxiety and depression PAIN:  Are you having pain? Yes: NPRS scale: Current: 0/10 Best: 0/10 Worst: 4/10 Pain location: right lateral hip  Pain description: soreness Aggravating factors: pressure, any twisting Relieving factors: ice  PRECAUTIONS: Posterior hip  RED FLAGS: None   WEIGHT BEARING RESTRICTIONS: No  FALLS:  Has patient fallen in last 6 months? No  LIVING ENVIRONMENT: Lives with: lives with their spouse Lives in: House/apartment Stairs: Yes: External: 4 steps; can reach both; going up the the left leg and down with the right leg first Has following  equipment at home: Vannie - 2 wheeled  OCCUPATION: Psychiatric Nurse in [employer]  PLOF: Independent  PATIENT GOALS: be able to walk longer without pain (several miles prior to surgery)and go back to the gym  NEXT MD VISIT: 01/21/24  OBJECTIVE:  Note: Objective measures were completed at Evaluation unless otherwise noted.  DIAGNOSTIC FINDINGS: 12/09/23 Right hip x-ray IMPRESSION: Right hip arthroplasty without immediate postoperative complication.  PATIENT SURVEYS:  LEFS  Extreme difficulty/unable (0), Quite a bit of difficulty (1), Moderate difficulty (2), Little difficulty (3), No difficulty (4) Survey date:  12/14/23 12/24/23  Any of your usual work, housework or school activities 2 3   2. Usual hobbies, recreational or sporting activities 0 4  3. Getting into/out of the bath 4 4  4. Walking between rooms 4 4  5. Putting on socks/shoes 0 1  6. Squatting  0 2  7. Lifting an object, like a bag of groceries from the floor 0 3  8. Performing light activities around your home 4 4  9. Performing heavy activities around your home 0 0  10. Getting into/out of a car 4 4  11. Walking 2 blocks 2 4  12. Walking 1 mile 0 0  13. Going up/down 10 stairs (1 flight) 3 3  14. Standing for 1 hour 0 0  15.  sitting for 1 hour 4 4  16. Running on even ground 0 0  17. Running on uneven ground 0 0  18. Making sharp turns while running fast 0 0  19. Hopping  0 0  20. Rolling over in bed 4 4  Score total:  31/80 44/80     COGNITION: Overall cognitive status: Within functional limits for tasks assessed     SENSATION: Patient reports no numbness or tingling  EDEMA:  Mild right hip edema   POSTURE: No Significant postural limitations  PALPATION: TTP: right hip flexor  LOWER EXTREMITY ROM:  Active ROM Right eval Left eval  Hip flexion 96; tight 116  Hip extension    Hip abduction    Hip adduction    Hip internal rotation    Hip external rotation    Knee flexion 126 112  Knee extension 3; pulling in thigh 0  Ankle dorsiflexion    Ankle plantarflexion    Ankle inversion    Ankle eversion     (Blank rows = not tested)  LOWER EXTREMITY MMT: not tested due to surgical condition  MMT Right eval Left eval  Hip flexion    Hip extension    Hip abduction    Hip adduction    Hip internal rotation    Hip external rotation    Knee flexion    Knee extension    Ankle dorsiflexion    Ankle plantarflexion    Ankle inversion    Ankle eversion     (Blank rows = not tested)  LOWER EXTREMITY SPECIAL TESTS:  Not tested due to surgical condition  FUNCTIONAL TESTS:  Timed up and go (TUG): 13.89 seconds with RW; 11.01 seconds without RW  2 minute walk test: 310 feet  with RW   GAIT: Distance walked: 310 feet Assistive device utilized: Walker - 2 wheeled Level of assistance: Modified independence Comments: decreased gait speed and stride length; she was able to ambulate short distances without an assistive device  TREATMENT DATE:                                     01/14/24 EXERCISE LOG  Exercise Repetitions and Resistance Comments  treadmill 1.8 mph @ 2% incline x 5 minutes    Cone taps   3 minutes 4 cones; including crossing midline; intermittent UE support   Hip vectors  BTB x 20 reps each  Flexion, ABD, ADD, and extension   Rocker board   3 minutes  AP w/ fingertip support   Self STM with tennis ball     Gait training  2 laps around gym to facilitate heel to toe gait pattern     Blank cell = exercise not performed today                                    01/11/24 EXERCISE LOG  Exercise Repetitions and Resistance Comments  treadmill 1.8 mph @ 2% incline x 5 minutes    Rocker board   2 minutes each  AP and lateral; intermittent UE support   Tandem stance on foam  3 x 30 seconds each  Intermittent UE support from parallel bars   Lunges onto BOSU   2 x 1 minute each  Ball up; intermittent UE support from parallel bars   Marching on BOSU  2 minutes  Ball up; BUE support from parallel bars   Sit to stand  20 reps  With tidal tank at chest   Blank cell = exercise not performed today   01/01/24: Rockerboard Rt/Lt x 2 min Treadmill Grade 3.0 1.5 x 1', 1.8 x1', 2.0 x 3' Discussed benefits of walking program Squat 20x Sidelying: Rt LE abduction 10x 7in step reciprocal pattern 5RT 7in Rt LE lateral step up 10 Prone: quad stretch with rope 2x 30 Seated: piriformis stretch 2x 30 Standing: hip flexor stretch on 2nd step (14in height) 2x 30 Tandem stance 1x 30 on floor, 1x 30 foam                                    12/28/23 EXERCISE LOG  Exercise Repetitions and Resistance Comments  Nustep  L3 x 7 minutes    Lunges onto step   15 reps each    Seated hip ABD isometric   2 minutes w/ 5 second hold    Seated HS stretch   4 x 30 seconds  RLE only   Double knee to chest   3 minutes  LE supported on green ball   R piriformis stretch  4 x 30 seconds        Blank cell = exercise not performed today   PATIENT EDUCATION:  Education details: HEP progression  Person educated: Patient Education method: Medical Illustrator Education comprehension: verbalized understanding and returned demonstration  HOME EXERCISE PROGRAM: Reviewed physician prescribed HEP (patient exhibited good recall)  Access Code: [email or web address] URL: [email or web address] Date: 12/17/2023 Prepared by: Augustin Mclean  Exercises - Hooklying Isometric Hip Abduction Adduction with Belt and Ball  - 1 x daily - 7 x weekly - 3 sets - 10 reps - Supine Bridge  - 1 x daily - 7 x weekly - 3 sets - 10 reps - Standing Hip Abduction with Counter Support  - 1  x daily - 7 x weekly - 3 sets - 10 reps - Standing Hip Extension with Counter Support  - 1 x daily - 7 x weekly - 3 sets - 10 reps - Sit to Stand  - 2 x daily - 7 x weekly - 3 sets - 10 reps - Seated Gastroc Stretch with Strap  - 2 x daily - 7 x weekly - 1 sets - 3 reps - 30 hold  01/01/24: - Prone Quadriceps Stretch with Strap  - 2 x daily - 7 x weekly - 1 sets - 3 reps - 30 hold - Seated Piriformis Stretch  - 2 x daily - 7 x weekly - 1 sets - 3 reps - 30 hold - Hip Flexor Stretch on Step  - 2 x daily - 7 x weekly - 1 sets - 3 reps - 30 hold  ASSESSMENT:  CLINICAL IMPRESSION: Patient was progressed with resisted hip vectors with moderate difficulty and fatigue. She required minimal cueing with resisted hip vectors to facilitate improved eccentric muscular control. She experienced no increase in pain or discomfort with any of today's interventions, but experienced  increase fatigue with hip vectors being the most fatiguing. She reported feeling tired, but not as tight upon the conclusion of treatment. Patient continues to require skilled physical therapy to address her remaining impairments to return to her prior level of function.    Eval:  Patient is a 58 y.o. female who was seen today for physical therapy evaluation and treatment following a right total hip arthroplasty with a posterior approach on 12/09/23. She presented with low pain severity and irritability with none of today's assessments significantly reproducing her familiar symptoms. Lower extremity manual muscle testing was held at this time due to her surgical condition. She exhibited no signs or symptoms of a post-operative complication. Her HEP was reviewed and she was able to exhibit good recall of these interventions. Recommend that she continue with skilled physical therapy to address her impairments to return to her prior level of function.    OBJECTIVE IMPAIRMENTS: Abnormal gait, decreased activity tolerance, decreased mobility, difficulty walking, decreased ROM, decreased strength, impaired tone, and pain.   ACTIVITY LIMITATIONS: lifting, standing, squatting, stairs, and locomotion level  PARTICIPATION LIMITATIONS: meal prep, cleaning, laundry, shopping, community activity, and occupation  PERSONAL FACTORS: 3+ comorbidities: ADHD, arthritis, HTN, Graves Disease, and a history of anxiety and depression are also affecting patient's functional outcome.   REHAB POTENTIAL: Excellent  CLINICAL DECISION MAKING: Stable/uncomplicated  EVALUATION COMPLEXITY: Low   GOALS: Goals reviewed with patient? Yes  SHORT TERM GOALS: Target date: 01/04/24 Patient will be independent with her initial HEP.  Baseline: Goal status: MET  2.  Patient will be able to ambulate at least 100 feet without an assistive device for improved household mobility.  Baseline:  Goal status: MET  3.  Patient will  improve her right hip flexion to at least 110 degrees for improved function squatting.  Baseline: 104 degrees on 12/24/23 Goal status: ON GOING  LONG TERM GOALS: Target date: 01/25/24  Patient will improve her LEFS score to at least 50/80 for improved perceived function with her daily activities.  Baseline: 12/24/23: 44/80 Goal status: ON GOING  2.  Patient will be able to navigate at least 4 steps with a reciprocal pattern for improved household mobility.  Baseline: able to complete without UE support  Goal status: MET  3.  Patient will report being able to return to her previous gym routine without being  limited by her right hip symptoms to safely return to her prior level of function.  Baseline:  Goal status: ON GOING   4.  Patient will be able to ambulate without an assistive device with no significant gait deviations for improved community mobility.  Baseline:  Goal status: ON GOING  5.  Patient will improve her by at least 50 feet for improved community mobility.  Baseline: 29 feet on 12/22/23 Goal status: ON GOING  PLAN:  PT FREQUENCY: 2x/week  PT DURATION: 6 weeks  PLANNED INTERVENTIONS: 97164- PT Re-evaluation, 97750- Physical Performance Testing, 97110-Therapeutic exercises, 97530- Therapeutic activity, 97112- Neuromuscular re-education, 97535- Self Care, [id number]- Manual therapy, [phone]- Gait training, Patient/Family education, Balance training, Stair training, Joint mobilization, Cryotherapy, and Moist heat  PLAN FOR NEXT SESSION: update HEP (as needed), lower extremity strengthening, and gait training.  Continue gait training on treadmill, gluteal strengthening and balance activities.  Lacinda JAYSON Fass, PT 01/14/2024, 10:26 AM

## 2024-01-18 ENCOUNTER — Encounter (HOSPITAL_COMMUNITY)

## 2024-01-20 ENCOUNTER — Ambulatory Visit (HOSPITAL_COMMUNITY)

## 2024-01-20 ENCOUNTER — Encounter (HOSPITAL_COMMUNITY): Payer: Self-pay

## 2024-01-20 DIAGNOSIS — M25551 Pain in right hip: Secondary | ICD-10-CM | POA: Diagnosis not present

## 2024-01-20 DIAGNOSIS — Z96641 Presence of right artificial hip joint: Secondary | ICD-10-CM

## 2024-01-20 DIAGNOSIS — R2689 Other abnormalities of gait and mobility: Secondary | ICD-10-CM

## 2024-01-20 NOTE — Therapy (Signed)
 OUTPATIENT PHYSICAL THERAPY LOWER EXTREMITY TREATMENT   Patient Name: [name] MRN: [id number] DOB:[birth year], 58 y.o., female Today's Date: 01/20/2024  END OF SESSION:  PT End of Session - 01/20/24 1118     Visit Number 9    Number of Visits 12    Date for Recertification  03/11/24    Authorization Type [employer]    Authorization Time Period no authorizatoin required    Progress Note Due on Visit 10    PT Start Time 1118    PT Stop Time 1158    PT Time Calculation (min) 40 min    Activity Tolerance Patient tolerated treatment well    Behavior During Therapy WFL for tasks assessed/performed              Past Medical History:  Diagnosis Date   ADHD (attention deficit hyperactivity disorder)    Anxiety    Arthritis    Autoimmune hepatitis (HCC)    Depression    Hypertension    Sleep apnea    does not use cpap very often per pt   Past Surgical History:  Procedure Laterality Date   ABDOMINAL HYSTERECTOMY     benign papilloma removed from right breast      CHOLECYSTECTOMY     KNEE SURGERY Bilateral    nerve root compression     TOTAL HIP ARTHROPLASTY Right 12/09/2023   Procedure: ARTHROPLASTY, HIP, TOTAL,POSTERIOR APPROACH;  Surgeon: Edna Toribio LABOR, MD;  Location: [facility];  Service: Orthopedics;  Laterality: Right;   There are no active problems to display for this patient.   PCP: Samie Frederick, PA-C  REFERRING PROVIDER: Edna Toribio LABOR, MD   REFERRING DIAG: post op right total hip arthroplasty   THERAPY DIAG:  Pain in right hip  Other abnormalities of gait and mobility  Presence of right artificial hip joint  Rationale for Evaluation and Treatment: Rehabilitation  ONSET DATE: 12/09/23  SUBJECTIVE:   SUBJECTIVE STATEMENT: Pt states she has applied a heel lift on the R side, helped with lop sided. Pt states today is 6 weeks.    Eval:  Patient reports that she had a right total hip replacement on 12/09/23. She feels that it has been  going well since surgery. She has begun to wean herself off her pain medication. She has been using a walker to get around. She was told not to bend at her waist prior to surgery. However, she was told by the PT in the hospital that she can bend over as long as it doesn't hurt. She feels that her constipation is her biggest problem right now. She is walking a little right now, but she wanted to start PT prior to walking more. She was given a HEP and she has been doing these for 10 reps twice per day.   PERTINENT HISTORY: ADHD, arthritis, HTN, Graves Disease, and a history of anxiety and depression, bilateral TKAs PAIN:  Are you having pain? Yes: NPRS scale: Current: 0/10 Best: 0/10 Worst: 4/10 Pain location: right lateral hip  Pain description: soreness Aggravating factors: pressure, any twisting Relieving factors: ice  PRECAUTIONS: Posterior hip  RED FLAGS: None   WEIGHT BEARING RESTRICTIONS: No  FALLS:  Has patient fallen in last 6 months? No  LIVING ENVIRONMENT: Lives with: lives with their spouse Lives in: House/apartment Stairs: Yes: External: 4 steps; can reach both; going up the the left leg and down with the right leg first Has following equipment at home: Vannie - 2 wheeled  OCCUPATION: Psychiatric Nurse in [employer]  PLOF: Independent  PATIENT GOALS: be able to walk longer without pain (several miles prior to surgery)and go back to the gym  NEXT MD VISIT: 01/21/24  OBJECTIVE:  Note: Objective measures were completed at Evaluation unless otherwise noted.  DIAGNOSTIC FINDINGS: 12/09/23 Right hip x-ray IMPRESSION: Right hip arthroplasty without immediate postoperative complication.  PATIENT SURVEYS:  LEFS  Extreme difficulty/unable (0), Quite a bit of difficulty (1), Moderate difficulty (2), Little difficulty (3), No difficulty (4) Survey date:  12/14/23 12/24/23  Any of your usual work, housework or school activities 2 3  2. Usual hobbies, recreational or  sporting activities 0 4  3. Getting into/out of the bath 4 4  4. Walking between rooms 4 4  5. Putting on socks/shoes 0 1  6. Squatting  0 2  7. Lifting an object, like a bag of groceries from the floor 0 3  8. Performing light activities around your home 4 4  9. Performing heavy activities around your home 0 0  10. Getting into/out of a car 4 4  11. Walking 2 blocks 2 4  12. Walking 1 mile 0 0  13. Going up/down 10 stairs (1 flight) 3 3  14. Standing for 1 hour 0 0  15.  sitting for 1 hour 4 4  16. Running on even ground 0 0  17. Running on uneven ground 0 0  18. Making sharp turns while running fast 0 0  19. Hopping  0 0  20. Rolling over in bed 4 4  Score total:  31/80 44/80     COGNITION: Overall cognitive status: Within functional limits for tasks assessed     SENSATION: Patient reports no numbness or tingling  EDEMA:  Mild right hip edema   POSTURE: No Significant postural limitations  PALPATION: TTP: right hip flexor  LOWER EXTREMITY ROM:  Active ROM Right eval Left eval  Hip flexion 96; tight 116  Hip extension    Hip abduction    Hip adduction    Hip internal rotation    Hip external rotation    Knee flexion 126 112  Knee extension 3; pulling in thigh 0  Ankle dorsiflexion    Ankle plantarflexion    Ankle inversion    Ankle eversion     (Blank rows = not tested)  LOWER EXTREMITY MMT: not tested due to surgical condition  MMT Right eval Left eval  Hip flexion    Hip extension    Hip abduction    Hip adduction    Hip internal rotation    Hip external rotation    Knee flexion    Knee extension    Ankle dorsiflexion    Ankle plantarflexion    Ankle inversion    Ankle eversion     (Blank rows = not tested)  LOWER EXTREMITY SPECIAL TESTS:  Not tested due to surgical condition  FUNCTIONAL TESTS:  Timed up and go (TUG): 13.89 seconds with RW; 11.01 seconds without RW  2 minute walk test: 310 feet with RW   GAIT: Distance walked:  310 feet Assistive device utilized: Walker - 2 wheeled Level of assistance: Modified independence Comments: decreased gait speed and stride length; she was able to ambulate short distances without an assistive device  TREATMENT DATE:   01/20/2024  Therapeutic Exercise: -Stationary, 5 minutes, level 4 resistance, pt cued for 50-70 spm -Supine bridges on green exercise ball, 2 sets of 8 reps, 5 second holds, pt cued for max hip extension -Monster walks and lateral stepping with GTB at ankles, 1 laps 20 feet per lap, pt cued for upright posture and athletic stance -Double knees to chest, 2 sets of 10 reps, on green theraball, pt cued for max pain free ROM and smooth motion Neuromuscular Re-education: -Captain morgans, green exercise ball, 1 set of 5 reps bilaterally, occasional UE support noted -Bird dog with pillow under knees, 2 set of 7 reps, bilaterally, pt cued for increased hip ROM and neutral spine throughout movement Therapeutic Activity: -Aeromat walks, tandem/lateral stepping, 2 laps each variation, 3 lengths of mat, with occasional UE support, pt cued for upright posture and slight bend in knees -Forward lunges on bosu ball, 2 set of 8 reps, bilaterally, pt cued for core activation and upright posture, one UE second set -Sit to stands with kettle bell, 2 sets of 7 reps, pt cued for core activation and keeping weight close to chest                                     01/14/24 EXERCISE LOG  Exercise Repetitions and Resistance Comments  treadmill 1.8 mph @ 2% incline x 5 minutes    Cone taps   3 minutes 4 cones; including crossing midline; intermittent UE support   Hip vectors  BTB x 20 reps each  Flexion, ABD, ADD, and extension   Rocker board   3 minutes  AP w/ fingertip support   Self STM with tennis ball     Gait training  2 laps around gym to  facilitate heel to toe gait pattern     Blank cell = exercise not performed today                                    01/11/24 EXERCISE LOG  Exercise Repetitions and Resistance Comments  treadmill 1.8 mph @ 2% incline x 5 minutes    Rocker board   2 minutes each  AP and lateral; intermittent UE support   Tandem stance on foam  3 x 30 seconds each  Intermittent UE support from parallel bars   Lunges onto BOSU   2 x 1 minute each  Ball up; intermittent UE support from parallel bars   Marching on BOSU  2 minutes  Ball up; BUE support from parallel bars   Sit to stand  20 reps  With tidal tank at chest   Blank cell = exercise not performed today   PATIENT EDUCATION:  Education details: HEP progression  Person educated: Patient Education method: Medical Illustrator Education comprehension: verbalized understanding and returned demonstration  HOME EXERCISE PROGRAM: Reviewed physician prescribed HEP (patient exhibited good recall)  Access Code: [email or web address] URL: [email or web address] Date: 12/17/2023 Prepared by: Augustin Mclean  Exercises - Hooklying Isometric Hip Abduction Adduction with Belt and Ball  - 1 x daily - 7 x weekly - 3 sets - 10 reps - Supine Bridge  - 1 x daily - 7 x weekly - 3 sets - 10 reps - Standing Hip Abduction with Counter Support  - 1 x daily - 7 x weekly - 3 sets - 10  reps - Standing Hip Extension with Counter Support  - 1 x daily - 7 x weekly - 3 sets - 10 reps - Sit to Stand  - 2 x daily - 7 x weekly - 3 sets - 10 reps - Seated Gastroc Stretch with Strap  - 2 x daily - 7 x weekly - 1 sets - 3 reps - 30 hold  01/01/24: - Prone Quadriceps Stretch with Strap  - 2 x daily - 7 x weekly - 1 sets - 3 reps - 30 hold - Seated Piriformis Stretch  - 2 x daily - 7 x weekly - 1 sets - 3 reps - 30 hold - Hip Flexor Stretch on Step  - 2 x daily - 7 x weekly - 1 sets - 3 reps - 30 hold  ASSESSMENT:  CLINICAL IMPRESSION: Patient continues to  demonstrate imporved LE strength/ROM, increased gait quality and balance. Patient also demonstrates increased endurance with aerobic based exercise during today's session on bike and minimal rest breaks required. Patient able to progress dynamic balance and core activation exercises today with banded walks and bird dogs, good performance with verbal cueing. Patient would continue to benefit from skilled physical therapy for increased endurance with ambulation, increased RLE strength/ROM, and improved balance for improved quality of life, improved independence with gait training and continued progress towards therapy goals.   Eval:  Patient is a 58 y.o. female who was seen today for physical therapy evaluation and treatment following a right total hip arthroplasty with a posterior approach on 12/09/23. She presented with low pain severity and irritability with none of today's assessments significantly reproducing her familiar symptoms. Lower extremity manual muscle testing was held at this time due to her surgical condition. She exhibited no signs or symptoms of a post-operative complication. Her HEP was reviewed and she was able to exhibit good recall of these interventions. Recommend that she continue with skilled physical therapy to address her impairments to return to her prior level of function.    OBJECTIVE IMPAIRMENTS: Abnormal gait, decreased activity tolerance, decreased mobility, difficulty walking, decreased ROM, decreased strength, impaired tone, and pain.   ACTIVITY LIMITATIONS: lifting, standing, squatting, stairs, and locomotion level  PARTICIPATION LIMITATIONS: meal prep, cleaning, laundry, shopping, community activity, and occupation  PERSONAL FACTORS: 3+ comorbidities: ADHD, arthritis, HTN, Graves Disease, and a history of anxiety and depression are also affecting patient's functional outcome.   REHAB POTENTIAL: Excellent  CLINICAL DECISION MAKING: Stable/uncomplicated  EVALUATION  COMPLEXITY: Low   GOALS: Goals reviewed with patient? Yes  SHORT TERM GOALS: Target date: 01/04/24 Patient will be independent with her initial HEP.  Baseline: Goal status: MET  2.  Patient will be able to ambulate at least 100 feet without an assistive device for improved household mobility.  Baseline:  Goal status: MET  3.  Patient will improve her right hip flexion to at least 110 degrees for improved function squatting.  Baseline: 104 degrees on 12/24/23 Goal status: ON GOING  LONG TERM GOALS: Target date: 01/25/24  Patient will improve her LEFS score to at least 50/80 for improved perceived function with her daily activities.  Baseline: 12/24/23: 44/80 Goal status: ON GOING  2.  Patient will be able to navigate at least 4 steps with a reciprocal pattern for improved household mobility.  Baseline: able to complete without UE support  Goal status: MET  3.  Patient will report being able to return to her previous gym routine without being limited by her right hip symptoms  to safely return to her prior level of function.  Baseline:  Goal status: ON GOING   4.  Patient will be able to ambulate without an assistive device with no significant gait deviations for improved community mobility.  Baseline:  Goal status: ON GOING  5.  Patient will improve her by at least 50 feet for improved community mobility.  Baseline: 29 feet on 12/22/23 Goal status: ON GOING  PLAN:  PT FREQUENCY: 2x/week  PT DURATION: 6 weeks  PLANNED INTERVENTIONS: 97164- PT Re-evaluation, 97750- Physical Performance Testing, 97110-Therapeutic exercises, 97530- Therapeutic activity, 97112- Neuromuscular re-education, 97535- Self Care, [id number]- Manual therapy, [phone]- Gait training, Patient/Family education, Balance training, Stair training, Joint mobilization, Cryotherapy, and Moist heat  PLAN FOR NEXT SESSION: update HEP (as needed), lower extremity strengthening, and gait training.  Continue gait  training on treadmill, gluteal strengthening and balance activities.  Lang Ada, PT, DPT [facility] Office: [phone] 12:02 PM, 01/20/24

## 2024-01-21 ENCOUNTER — Encounter (HOSPITAL_COMMUNITY)

## 2024-01-22 ENCOUNTER — Other Ambulatory Visit (HOSPITAL_COMMUNITY): Payer: Self-pay | Admitting: Nurse Practitioner

## 2024-01-22 DIAGNOSIS — K760 Fatty (change of) liver, not elsewhere classified: Secondary | ICD-10-CM

## 2024-01-22 DIAGNOSIS — K754 Autoimmune hepatitis: Secondary | ICD-10-CM

## 2024-01-22 DIAGNOSIS — K7400 Hepatic fibrosis, unspecified: Secondary | ICD-10-CM

## 2024-01-25 ENCOUNTER — Ambulatory Visit (HOSPITAL_COMMUNITY)

## 2024-01-25 ENCOUNTER — Encounter (HOSPITAL_COMMUNITY): Payer: Self-pay

## 2024-01-25 DIAGNOSIS — Z96641 Presence of right artificial hip joint: Secondary | ICD-10-CM

## 2024-01-25 DIAGNOSIS — M25551 Pain in right hip: Secondary | ICD-10-CM

## 2024-01-25 DIAGNOSIS — R2689 Other abnormalities of gait and mobility: Secondary | ICD-10-CM

## 2024-01-25 NOTE — Therapy (Signed)
 OUTPATIENT PHYSICAL THERAPY LOWER EXTREMITY TREATMENT   Patient Name: [name] MRN: [id number] DOB:04-11-1965, 58 y.o., female Today's Date: 01/25/2024  END OF SESSION:  PT End of Session - 01/25/24 0734     Visit Number 10    Number of Visits 12    Date for Recertification  03/11/24    Authorization Type [employer]    Authorization Time Period no authorizatoin required    Progress Note Due on Visit 10    PT Start Time [phone]   Patient arrived late to her appoointment.   PT Stop Time 0817    PT Time Calculation (min) 42 min    Activity Tolerance Patient tolerated treatment well    Behavior During Therapy WFL for tasks assessed/performed               Past Medical History:  Diagnosis Date   ADHD (attention deficit hyperactivity disorder)    Anxiety    Arthritis    Autoimmune hepatitis (HCC)    Depression    Hypertension    Sleep apnea    does not use cpap very often per pt   Past Surgical History:  Procedure Laterality Date   ABDOMINAL HYSTERECTOMY     benign papilloma removed from right breast      CHOLECYSTECTOMY     KNEE SURGERY Bilateral    nerve root compression     TOTAL HIP ARTHROPLASTY Right 12/09/2023   Procedure: ARTHROPLASTY, HIP, TOTAL,POSTERIOR APPROACH;  Surgeon: Edna Toribio LABOR, MD;  Location: [facility];  Service: Orthopedics;  Laterality: Right;   There are no active problems to display for this patient.   PCP: Samie Frederick, PA-C  REFERRING PROVIDER: Edna Toribio LABOR, MD   REFERRING DIAG: post op right total hip arthroplasty   THERAPY DIAG:  Pain in right hip  Other abnormalities of gait and mobility  Presence of right artificial hip joint  Rationale for Evaluation and Treatment: Rehabilitation  ONSET DATE: 12/09/23  SUBJECTIVE:   SUBJECTIVE STATEMENT: Patient reports that she feels good today and she has been cleared to return to work. She was told to stop using her heel lift until 6 months after surgery. She feels like  she is back to where she needs to be, but she can tell that her stamina.   Eval:  Patient reports that she had a right total hip replacement on 12/09/23. She feels that it has been going well since surgery. She has begun to wean herself off her pain medication. She has been using a walker to get around. She was told not to bend at her waist prior to surgery. However, she was told by the PT in the hospital that she can bend over as long as it doesn't hurt. She feels that her constipation is her biggest problem right now. She is walking a little right now, but she wanted to start PT prior to walking more. She was given a HEP and she has been doing these for 10 reps twice per day.   PERTINENT HISTORY: ADHD, arthritis, HTN, Graves Disease, and a history of anxiety and depression, bilateral TKAs PAIN:  Are you having pain? Yes: NPRS scale: Current: 0/10 Best: 0/10 Worst: 4/10 Pain location: right lateral hip  Pain description: soreness Aggravating factors: pressure, any twisting Relieving factors: ice  PRECAUTIONS: Posterior hip  RED FLAGS: None   WEIGHT BEARING RESTRICTIONS: No  FALLS:  Has patient fallen in last 6 months? No  LIVING ENVIRONMENT: Lives with: lives with their spouse  Lives in: House/apartment Stairs: Yes: External: 4 steps; can reach both; going up the the left leg and down with the right leg first Has following equipment at home: Vannie - 2 wheeled  OCCUPATION: Psychiatric Nurse in [employer]  PLOF: Independent  PATIENT GOALS: be able to walk longer without pain (several miles prior to surgery)and go back to the gym  NEXT MD VISIT: 01/21/24  OBJECTIVE:  Note: Objective measures were completed at Evaluation unless otherwise noted.  DIAGNOSTIC FINDINGS: 12/09/23 Right hip x-ray IMPRESSION: Right hip arthroplasty without immediate postoperative complication.  PATIENT SURVEYS:  LEFS  Extreme difficulty/unable (0), Quite a bit of difficulty (1), Moderate  difficulty (2), Little difficulty (3), No difficulty (4) Survey date:  12/14/23 12/24/23 01/25/24  Any of your usual work, housework or school activities 2 3 4   2. Usual hobbies, recreational or sporting activities 0 4 4  3. Getting into/out of the bath 4 4 4   4. Walking between rooms 4 4 4   5. Putting on socks/shoes 0 1 4  6. Squatting  0 2 4  7. Lifting an object, like a bag of groceries from the floor 0 3 4  8. Performing light activities around your home 4 4 4   9. Performing heavy activities around your home 0 0 4  10. Getting into/out of a car 4 4 4   11. Walking 2 blocks 2 4 4   12. Walking 1 mile 0 0 4  13. Going up/down 10 stairs (1 flight) 3 3 4   14. Standing for 1 hour 0 0 4  15.  sitting for 1 hour 4 4 4   16. Running on even ground 0 0 0  17. Running on uneven ground 0 0 0  18. Making sharp turns while running fast 0 0 0  19. Hopping  0 0 4  20. Rolling over in bed 4 4 4   Score total:  31/80 44/80 68/80      COGNITION: Overall cognitive status: Within functional limits for tasks assessed     SENSATION: Patient reports no numbness or tingling  EDEMA:  Mild right hip edema   POSTURE: No Significant postural limitations  PALPATION: TTP: right hip flexor  LOWER EXTREMITY ROM:  Active ROM Right eval Left eval  Hip flexion 96; tight 116  Hip extension    Hip abduction    Hip adduction    Hip internal rotation    Hip external rotation    Knee flexion 126 112  Knee extension 3; pulling in thigh 0  Ankle dorsiflexion    Ankle plantarflexion    Ankle inversion    Ankle eversion     (Blank rows = not tested)  LOWER EXTREMITY MMT: not tested due to surgical condition  MMT Right eval Left eval  Hip flexion    Hip extension    Hip abduction    Hip adduction    Hip internal rotation    Hip external rotation    Knee flexion    Knee extension    Ankle dorsiflexion    Ankle plantarflexion    Ankle inversion    Ankle eversion     (Blank rows = not  tested)  LOWER EXTREMITY SPECIAL TESTS:  Not tested due to surgical condition  FUNCTIONAL TESTS:  Timed up and go (TUG): 13.89 seconds with RW; 11.01 seconds without RW  2 minute walk test: 310 feet with RW   GAIT: Distance walked: 310 feet Assistive device utilized: Walker - 2 wheeled Level of  assistance: Modified independence Comments: decreased gait speed and stride length; she was able to ambulate short distances without an assistive device                                                                                                                                TREATMENT DATE:                                     01/25/24 EXERCISE LOG  Exercise Repetitions and Resistance Comments  Treadmill 2.0 mph @ 2% incline x 5 minutes    Goal assessment   See below    Bridge progression    Bridge with march and SL bridge   [place]ell progression   With ABD and ABD w/ extension        Blank cell = exercise not performed today   01/20/2024  Therapeutic Exercise: -Stationary, 5 minutes, level 4 resistance, pt cued for 50-70 spm -Supine bridges on green exercise ball, 2 sets of 8 reps, 5 second holds, pt cued for max hip extension -Monster walks and lateral stepping with GTB at ankles, 1 laps 20 feet per lap, pt cued for upright posture and athletic stance -Double knees to chest, 2 sets of 10 reps, on green theraball, pt cued for max pain free ROM and smooth motion Neuromuscular Re-education: -Captain morgans, green exercise ball, 1 set of 5 reps bilaterally, occasional UE support noted -Bird dog with pillow under knees, 2 set of 7 reps, bilaterally, pt cued for increased hip ROM and neutral spine throughout movement Therapeutic Activity: -Aeromat walks, tandem/lateral stepping, 2 laps each variation, 3 lengths of mat, with occasional UE support, pt cued for upright posture and slight bend in knees -Forward lunges on bosu ball, 2 set of 8 reps, bilaterally, pt cued for core activation  and upright posture, one UE second set -Sit to stands with kettle bell, 2 sets of 7 reps, pt cued for core activation and keeping weight close to chest                                     01/14/24 EXERCISE LOG  Exercise Repetitions and Resistance Comments  treadmill 1.8 mph @ 2% incline x 5 minutes    Cone taps   3 minutes 4 cones; including crossing midline; intermittent UE support   Hip vectors  BTB x 20 reps each  Flexion, ABD, ADD, and extension   Rocker board   3 minutes  AP w/ fingertip support   Self STM with tennis ball     Gait training  2 laps around gym to facilitate heel to toe gait pattern     Blank cell = exercise not performed today   PATIENT EDUCATION:  Education details: HEP progression  Person educated: Patient  Education method: Medical Illustrator Education comprehension: verbalized understanding and returned demonstration  HOME EXERCISE PROGRAM: Access Code: [email or web address] URL: [email or web address] Date: 01/25/2024 Prepared by: Lacinda Fass  Exercises - Marching Bridge  - 1 x daily - 7 x weekly - 3 sets - 10 reps - Single Leg Bridge  - 1 x daily - 7 x weekly - 3 sets - 10 reps - Bird Dog  - 1 x daily - 7 x weekly - 3 sets - 10 reps - Clamshell in Abduction  - 1 x daily - 7 x weekly - 3 sets - 10 reps - Sidestepping in Squat with Resistance and Arms Forward  - 1 x daily - 7 x weekly - 3 sets - 10 reps - Bridge with Heels on [employer]  - 1 x daily - 7 x weekly - 3 sets - 10 reps - Bird Dog on Counter  - 1 x daily - 7 x weekly - 3 sets - 10 reps - Bird Dog on [employer]  - 1 x daily - 7 x weekly - 3 sets - 10 reps  Access Code: [email or web address] URL: [email or web address] Date: 12/17/2023 Prepared by: Augustin Mclean  Exercises - Hooklying Isometric Hip Abduction Adduction with Belt and Ball  - 1 x daily - 7 x weekly - 3 sets - 10 reps - Supine Bridge  - 1 x daily - 7 x weekly - 3 sets - 10 reps - Standing Hip Abduction with  Counter Support  - 1 x daily - 7 x weekly - 3 sets - 10 reps - Standing Hip Extension with Counter Support  - 1 x daily - 7 x weekly - 3 sets - 10 reps - Sit to Stand  - 2 x daily - 7 x weekly - 3 sets - 10 reps - Seated Gastroc Stretch with Strap  - 2 x daily - 7 x weekly - 1 sets - 3 reps - 30 hold  01/01/24: - Prone Quadriceps Stretch with Strap  - 2 x daily - 7 x weekly - 1 sets - 3 reps - 30 hold - Seated Piriformis Stretch  - 2 x daily - 7 x weekly - 1 sets - 3 reps - 30 hold - Hip Flexor Stretch on Step  - 2 x daily - 7 x weekly - 1 sets - 3 reps - 30 hold  ASSESSMENT:  CLINICAL IMPRESSION: Patient has made excellent progress with skilled physical therapy. This is evidenced by her subjective reports, objective measures, functional mobility, and progress toward her goals. She was able to meet all of her goals for skilled physical therapy at this time. Her HEP was reviewed and updated. She was educated on ways to progress these interventions and a handout was provided. She reported feeling comfortable with these interventions and being discharged at this time.   PHYSICAL THERAPY DISCHARGE SUMMARY  Visits from Start of Care: 10  Current functional level related to goals / functional outcomes: Patient was able to meet all of her goals for skilled physical therapy.    Remaining deficits: None    Education / Equipment: HEP    Patient agrees to discharge. Patient goals were met. Patient is being discharged due to meeting the stated rehab goals.    Eval:  Patient is a 58 y.o. female who was seen today for physical therapy evaluation and treatment following a right total hip arthroplasty with a posterior approach on 12/09/23. She presented with low pain severity and irritability with none  of today's assessments significantly reproducing her familiar symptoms. Lower extremity manual muscle testing was held at this time due to her surgical condition. She exhibited no signs or symptoms  of a post-operative complication. Her HEP was reviewed and she was able to exhibit good recall of these interventions. Recommend that she continue with skilled physical therapy to address her impairments to return to her prior level of function.    OBJECTIVE IMPAIRMENTS: Abnormal gait, decreased activity tolerance, decreased mobility, difficulty walking, decreased ROM, decreased strength, impaired tone, and pain.   ACTIVITY LIMITATIONS: lifting, standing, squatting, stairs, and locomotion level  PARTICIPATION LIMITATIONS: meal prep, cleaning, laundry, shopping, community activity, and occupation  PERSONAL FACTORS: 3+ comorbidities: ADHD, arthritis, HTN, Graves Disease, and a history of anxiety and depression are also affecting patient's functional outcome.   REHAB POTENTIAL: Excellent  CLINICAL DECISION MAKING: Stable/uncomplicated  EVALUATION COMPLEXITY: Low   GOALS: Goals reviewed with patient? Yes  SHORT TERM GOALS: Target date: 01/04/24 Patient will be independent with her initial HEP.  Baseline: Goal status: MET  2.  Patient will be able to ambulate at least 100 feet without an assistive device for improved household mobility.  Baseline:  Goal status: MET  3.  Patient will improve her right hip flexion to at least 110 degrees for improved function squatting.  Baseline: 104 degrees on 12/24/23 01/25/24: 119 degrees Goal status: MET   LONG TERM GOALS: Target date: 01/25/24  Patient will improve her LEFS score to at least 50/80 for improved perceived function with her daily activities.  Baseline: 12/24/23: 44/80 01/25/24: 68/80 Goal status: MET   2.  Patient will be able to navigate at least 4 steps with a reciprocal pattern for improved household mobility.  Baseline: able to complete without UE support  Goal status: MET  3.  Patient will report being able to return to her previous gym routine without being limited by her right hip symptoms to safely return to her prior  level of function.  Baseline:  Goal status: MET  4.  Patient will be able to ambulate without an assistive device with no significant gait deviations for improved community mobility.  Baseline:  Goal status: MET  5.  Patient will improve her by at least 50 feet for improved community mobility.  Baseline: 29 feet on 12/22/23 01/25/24: 506 feet (196 feet improvement)  Goal status: MET   PLAN:  PT FREQUENCY: 2x/week  PT DURATION: 6 weeks  PLANNED INTERVENTIONS: 97164- PT Re-evaluation, 97750- Physical Performance Testing, 97110-Therapeutic exercises, 97530- Therapeutic activity, 97112- Neuromuscular re-education, 97535- Self Care, [id number]- Manual therapy, [phone]- Gait training, Patient/Family education, Balance training, Stair training, Joint mobilization, Cryotherapy, and Moist heat  PLAN FOR NEXT SESSION: update HEP (as needed), lower extremity strengthening, and gait training.  Continue gait training on treadmill, gluteal strengthening and balance activities.  Lacinda Fass, PT, DPT  9:18 AM, 01/25/2024

## 2024-01-28 ENCOUNTER — Encounter (HOSPITAL_COMMUNITY)

## 2024-02-01 ENCOUNTER — Encounter (HOSPITAL_COMMUNITY)

## 2024-02-03 ENCOUNTER — Encounter (HOSPITAL_COMMUNITY)

## 2024-02-08 ENCOUNTER — Encounter (HOSPITAL_COMMUNITY)

## 2024-02-10 ENCOUNTER — Encounter (HOSPITAL_COMMUNITY)

## 2024-02-15 ENCOUNTER — Encounter (HOSPITAL_COMMUNITY)

## 2024-03-25 ENCOUNTER — Ambulatory Visit (HOSPITAL_COMMUNITY)
